# Patient Record
Sex: Female | Born: 1945
Health system: Southern US, Community
[De-identification: ages and names within clinical notes are randomized; demographics above are authoritative.]

## PROBLEM LIST (undated history)

## (undated) DIAGNOSIS — J189 Pneumonia, unspecified organism: Secondary | ICD-10-CM

## (undated) DIAGNOSIS — J449 Chronic obstructive pulmonary disease, unspecified: Secondary | ICD-10-CM

## (undated) HISTORY — PX: DILATION AND CURETTAGE, DIAGNOSTIC / THERAPEUTIC: SUR384

## (undated) HISTORY — DX: Pneumonia, unspecified organism: J18.9

## (undated) HISTORY — PX: SKIN CANCER EXCISION: SHX779

---

## 1999-02-07 ENCOUNTER — Encounter: Payer: Self-pay | Admitting: Emergency Medicine

## 1999-02-07 ENCOUNTER — Emergency Department (HOSPITAL_COMMUNITY): Admission: EM | Admit: 1999-02-07 | Discharge: 1999-02-07 | Payer: Self-pay | Admitting: Emergency Medicine

## 1999-02-21 ENCOUNTER — Ambulatory Visit (HOSPITAL_COMMUNITY): Admission: RE | Admit: 1999-02-21 | Discharge: 1999-02-21 | Payer: Self-pay | Admitting: Family Medicine

## 2010-05-29 ENCOUNTER — Ambulatory Visit (HOSPITAL_COMMUNITY): Payer: Self-pay

## 2011-02-18 ENCOUNTER — Emergency Department (HOSPITAL_COMMUNITY)
Admission: EM | Admit: 2011-02-18 | Discharge: 2011-02-18 | Disposition: A | Discharge status: Home / Self Care | Payer: Self-pay

## 2011-02-18 MED ORDER — ADENOSINE 6 MG/2ML IV SOLN
INTRAVENOUS | Status: AC
  Filled 2011-02-18: qty 4

## 2012-10-01 ENCOUNTER — Telehealth: Payer: Self-pay | Admitting: Cardiovascular Disease

## 2012-12-08 ENCOUNTER — Telehealth: Payer: Self-pay | Admitting: Pharmacist Clinician (PhC)/ Clinical Pharmacy Specialist

## 2012-12-08 NOTE — Telephone Encounter (Signed)
called both numbers to schedule an appointment. Both numbers were disconnected

## 2012-12-12 ENCOUNTER — Encounter (INDEPENDENT_AMBULATORY_CARE_PROVIDER_SITE_OTHER): Payer: Medicare Other | Admitting: Physician Assistant

## 2012-12-12 ENCOUNTER — Ambulatory Visit: Payer: Self-pay | Admitting: Pharmacist Clinician (PhC)/ Clinical Pharmacy Specialist

## 2012-12-12 NOTE — Progress Notes (Deleted)
    Date:  12/12/2012   ID:  [name], DOB December 10, 1945, MRN [id number]  PCP:  Blane Ohara, MD  Primary Cardiologist:  ***     History of Present Illness: Deborah Wallace is a 67 y.o. female ***  The patient currently denies nausea, vomiting, fever, chest pain, shortness of breath, orthopnea, dizziness, PND, cough, congestion, abdominal pain, hematochezia, melena, lower extremity edema, claudication.  Wt Readings from Last 3 Encounters:  No data found for Wt     No past medical history on file.  Current Outpatient Prescriptions  Medication Sig Dispense Refill  . atorvastatin (LIPITOR) 80 MG tablet Take 80 mg by mouth daily.       No current facility-administered medications for this visit.    Allergies:   Allergies not on file  Social History:  The patient     Family history:  No family history on file.  ROS:  Please see the history of present illness.  All other systems reviewed and negative.   PHYSICAL EXAM: VS:  There were no vitals taken for this visit. Well nourished, well developed, in no acute distress HEENT: Pupils are equal round react to light accommodation extraocular movements are intact.  Neck: no JVDNo cervical lymphadenopathy. Cardiac: Regular rate and rhythm without murmurs rubs or gallops. Lungs:  clear to auscultation bilaterally, no wheezing, rhonchi or rales Abd: soft, nontender, positive bowel sounds all quadrants, no hepatosplenomegaly Ext: no lower extremity edema.  2+ radial and dorsalis pedis pulses. Skin: warm and dry Neuro:  Grossly normal  EKG:       ASSESSMENT AND PLAN:  Problem List Items Addressed This Visit   None

## 2012-12-16 NOTE — Progress Notes (Signed)
This encounter was created in error - please disregard.

## 2013-06-22 ENCOUNTER — Other Ambulatory Visit: Payer: Medicare Other

## 2013-07-28 NOTE — Telephone Encounter (Signed)
Close encounter 

## 2016-03-30 DIAGNOSIS — J449 Chronic obstructive pulmonary disease, unspecified: Secondary | ICD-10-CM | POA: Diagnosis not present

## 2016-03-30 DIAGNOSIS — F4321 Adjustment disorder with depressed mood: Secondary | ICD-10-CM | POA: Diagnosis not present

## 2016-03-30 DIAGNOSIS — H6981 Other specified disorders of Eustachian tube, right ear: Secondary | ICD-10-CM | POA: Diagnosis not present

## 2016-03-30 DIAGNOSIS — R634 Abnormal weight loss: Secondary | ICD-10-CM | POA: Diagnosis not present

## 2016-03-30 DIAGNOSIS — J4530 Mild persistent asthma, uncomplicated: Secondary | ICD-10-CM | POA: Diagnosis not present

## 2016-03-30 DIAGNOSIS — Z79899 Other long term (current) drug therapy: Secondary | ICD-10-CM | POA: Diagnosis not present

## 2016-03-30 DIAGNOSIS — Z0000 Encounter for general adult medical examination without abnormal findings: Secondary | ICD-10-CM | POA: Diagnosis not present

## 2016-04-30 DIAGNOSIS — F4321 Adjustment disorder with depressed mood: Secondary | ICD-10-CM | POA: Diagnosis not present

## 2016-04-30 DIAGNOSIS — R634 Abnormal weight loss: Secondary | ICD-10-CM | POA: Diagnosis not present

## 2016-04-30 DIAGNOSIS — R29898 Other symptoms and signs involving the musculoskeletal system: Secondary | ICD-10-CM | POA: Diagnosis not present

## 2016-07-30 DIAGNOSIS — Z13820 Encounter for screening for osteoporosis: Secondary | ICD-10-CM | POA: Diagnosis not present

## 2016-07-30 DIAGNOSIS — Z1231 Encounter for screening mammogram for malignant neoplasm of breast: Secondary | ICD-10-CM | POA: Diagnosis not present

## 2016-07-30 DIAGNOSIS — R634 Abnormal weight loss: Secondary | ICD-10-CM | POA: Diagnosis not present

## 2016-07-30 DIAGNOSIS — Z780 Asymptomatic menopausal state: Secondary | ICD-10-CM | POA: Diagnosis not present

## 2016-07-30 DIAGNOSIS — F4321 Adjustment disorder with depressed mood: Secondary | ICD-10-CM | POA: Diagnosis not present

## 2016-09-17 DIAGNOSIS — Z1231 Encounter for screening mammogram for malignant neoplasm of breast: Secondary | ICD-10-CM | POA: Diagnosis not present

## 2016-09-17 DIAGNOSIS — R928 Other abnormal and inconclusive findings on diagnostic imaging of breast: Secondary | ICD-10-CM | POA: Diagnosis not present

## 2017-01-03 DIAGNOSIS — R229 Localized swelling, mass and lump, unspecified: Secondary | ICD-10-CM | POA: Diagnosis not present

## 2017-01-15 DIAGNOSIS — C44399 Other specified malignant neoplasm of skin of other parts of face: Secondary | ICD-10-CM | POA: Diagnosis not present

## 2017-01-15 DIAGNOSIS — C44319 Basal cell carcinoma of skin of other parts of face: Secondary | ICD-10-CM | POA: Diagnosis not present

## 2017-01-22 DIAGNOSIS — D485 Neoplasm of uncertain behavior of skin: Secondary | ICD-10-CM | POA: Diagnosis not present

## 2017-01-22 DIAGNOSIS — L989 Disorder of the skin and subcutaneous tissue, unspecified: Secondary | ICD-10-CM | POA: Diagnosis not present

## 2017-01-23 DIAGNOSIS — C4490 Unspecified malignant neoplasm of skin, unspecified: Secondary | ICD-10-CM | POA: Diagnosis not present

## 2017-01-28 DIAGNOSIS — J4530 Mild persistent asthma, uncomplicated: Secondary | ICD-10-CM | POA: Diagnosis not present

## 2017-01-28 DIAGNOSIS — J449 Chronic obstructive pulmonary disease, unspecified: Secondary | ICD-10-CM | POA: Diagnosis not present

## 2017-01-28 DIAGNOSIS — F4321 Adjustment disorder with depressed mood: Secondary | ICD-10-CM | POA: Diagnosis not present

## 2017-01-28 DIAGNOSIS — L989 Disorder of the skin and subcutaneous tissue, unspecified: Secondary | ICD-10-CM | POA: Diagnosis not present

## 2017-01-30 DIAGNOSIS — C44329 Squamous cell carcinoma of skin of other parts of face: Secondary | ICD-10-CM | POA: Diagnosis not present

## 2017-01-30 DIAGNOSIS — D485 Neoplasm of uncertain behavior of skin: Secondary | ICD-10-CM | POA: Diagnosis not present

## 2017-01-30 DIAGNOSIS — L989 Disorder of the skin and subcutaneous tissue, unspecified: Secondary | ICD-10-CM | POA: Diagnosis not present

## 2017-01-30 DIAGNOSIS — J449 Chronic obstructive pulmonary disease, unspecified: Secondary | ICD-10-CM | POA: Diagnosis not present

## 2017-01-30 DIAGNOSIS — F17200 Nicotine dependence, unspecified, uncomplicated: Secondary | ICD-10-CM | POA: Diagnosis not present

## 2017-01-30 DIAGNOSIS — S0990XA Unspecified injury of head, initial encounter: Secondary | ICD-10-CM | POA: Diagnosis not present

## 2017-01-30 DIAGNOSIS — S0180XA Unspecified open wound of other part of head, initial encounter: Secondary | ICD-10-CM | POA: Diagnosis not present

## 2017-01-30 DIAGNOSIS — S0031XA Abrasion of nose, initial encounter: Secondary | ICD-10-CM | POA: Diagnosis not present

## 2017-01-30 DIAGNOSIS — W19XXXA Unspecified fall, initial encounter: Secondary | ICD-10-CM | POA: Diagnosis not present

## 2017-01-30 DIAGNOSIS — L7622 Postprocedural hemorrhage and hematoma of skin and subcutaneous tissue following other procedure: Secondary | ICD-10-CM | POA: Diagnosis not present

## 2017-01-30 DIAGNOSIS — S098XXA Other specified injuries of head, initial encounter: Secondary | ICD-10-CM | POA: Diagnosis not present

## 2017-01-30 DIAGNOSIS — M542 Cervicalgia: Secondary | ICD-10-CM | POA: Diagnosis not present

## 2017-01-31 DIAGNOSIS — W19XXXD Unspecified fall, subsequent encounter: Secondary | ICD-10-CM | POA: Diagnosis not present

## 2017-01-31 DIAGNOSIS — Z5189 Encounter for other specified aftercare: Secondary | ICD-10-CM | POA: Diagnosis not present

## 2017-01-31 DIAGNOSIS — S0101XD Laceration without foreign body of scalp, subsequent encounter: Secondary | ICD-10-CM | POA: Diagnosis not present

## 2017-03-07 DIAGNOSIS — C4492 Squamous cell carcinoma of skin, unspecified: Secondary | ICD-10-CM | POA: Diagnosis not present

## 2017-03-07 DIAGNOSIS — T8131XD Disruption of external operation (surgical) wound, not elsewhere classified, subsequent encounter: Secondary | ICD-10-CM | POA: Diagnosis not present

## 2017-05-25 ENCOUNTER — Other Ambulatory Visit: Payer: Self-pay

## 2017-05-25 ENCOUNTER — Encounter (HOSPITAL_COMMUNITY): Payer: Self-pay | Admitting: Emergency Medicine

## 2017-05-25 ENCOUNTER — Emergency Department (HOSPITAL_COMMUNITY): Payer: Medicare Other

## 2017-05-25 ENCOUNTER — Inpatient Hospital Stay (HOSPITAL_COMMUNITY)
Admission: EM | Admit: 2017-05-25 | Discharge: 2017-05-31 | DRG: 190 | Disposition: A | Discharge status: Home Health | Payer: Medicare Other | Attending: Internal Medicine | Admitting: Internal Medicine

## 2017-05-25 DIAGNOSIS — J209 Acute bronchitis, unspecified: Secondary | ICD-10-CM | POA: Diagnosis present

## 2017-05-25 DIAGNOSIS — I493 Ventricular premature depolarization: Secondary | ICD-10-CM | POA: Diagnosis present

## 2017-05-25 DIAGNOSIS — E440 Moderate protein-calorie malnutrition: Secondary | ICD-10-CM | POA: Diagnosis present

## 2017-05-25 DIAGNOSIS — J9601 Acute respiratory failure with hypoxia: Secondary | ICD-10-CM | POA: Diagnosis not present

## 2017-05-25 DIAGNOSIS — Z6823 Body mass index (BMI) 23.0-23.9, adult: Secondary | ICD-10-CM | POA: Diagnosis not present

## 2017-05-25 DIAGNOSIS — R05 Cough: Secondary | ICD-10-CM | POA: Diagnosis not present

## 2017-05-25 DIAGNOSIS — J440 Chronic obstructive pulmonary disease with acute lower respiratory infection: Secondary | ICD-10-CM | POA: Diagnosis present

## 2017-05-25 DIAGNOSIS — D649 Anemia, unspecified: Secondary | ICD-10-CM | POA: Diagnosis present

## 2017-05-25 DIAGNOSIS — F17210 Nicotine dependence, cigarettes, uncomplicated: Secondary | ICD-10-CM | POA: Diagnosis present

## 2017-05-25 DIAGNOSIS — R0602 Shortness of breath: Secondary | ICD-10-CM | POA: Diagnosis not present

## 2017-05-25 DIAGNOSIS — R0789 Other chest pain: Secondary | ICD-10-CM | POA: Diagnosis not present

## 2017-05-25 DIAGNOSIS — Z79899 Other long term (current) drug therapy: Secondary | ICD-10-CM

## 2017-05-25 DIAGNOSIS — R7989 Other specified abnormal findings of blood chemistry: Secondary | ICD-10-CM | POA: Diagnosis present

## 2017-05-25 DIAGNOSIS — R079 Chest pain, unspecified: Secondary | ICD-10-CM | POA: Diagnosis present

## 2017-05-25 DIAGNOSIS — R402413 Glasgow coma scale score 13-15, at hospital admission: Secondary | ICD-10-CM | POA: Diagnosis present

## 2017-05-25 DIAGNOSIS — J441 Chronic obstructive pulmonary disease with (acute) exacerbation: Principal | ICD-10-CM | POA: Diagnosis present

## 2017-05-25 HISTORY — DX: Chronic obstructive pulmonary disease, unspecified: J44.9

## 2017-05-25 LAB — URINALYSIS, ROUTINE W REFLEX MICROSCOPIC
Bilirubin Urine: NEGATIVE
Glucose, UA: NEGATIVE mg/dL
KETONES UR: NEGATIVE mg/dL
Leukocytes, UA: NEGATIVE
Nitrite: NEGATIVE
PROTEIN: 100 mg/dL — AB
Specific Gravity, Urine: 1.028 (ref 1.005–1.030)
pH: 5 (ref 5.0–8.0)

## 2017-05-25 LAB — BASIC METABOLIC PANEL
ANION GAP: 15 (ref 5–15)
BUN: 34 mg/dL — AB (ref 6–20)
CHLORIDE: 96 mmol/L — AB (ref 101–111)
CO2: 27 mmol/L (ref 22–32)
Calcium: 9.6 mg/dL (ref 8.9–10.3)
Creatinine, Ser: 0.99 mg/dL (ref 0.44–1.00)
GFR calc Af Amer: >60 mL/min (ref >60)
GFR, EST NON AFRICAN AMERICAN: 55 mL/min — AB (ref >60)
GLUCOSE: 197 mg/dL — AB (ref 65–99)
POTASSIUM: 3.9 mmol/L (ref 3.5–5.1)
Sodium: 138 mmol/L (ref 135–145)

## 2017-05-25 LAB — COMPREHENSIVE METABOLIC PANEL
ALK PHOS: 98 U/L (ref 38–126)
ALT: 10 U/L — ABNORMAL LOW (ref 14–54)
AST: 17 U/L (ref 15–41)
Albumin: 2.6 g/dL — ABNORMAL LOW (ref 3.5–5.0)
Anion gap: 14 (ref 5–15)
BUN: 34 mg/dL — AB (ref 6–20)
CALCIUM: 9.4 mg/dL (ref 8.9–10.3)
CHLORIDE: 98 mmol/L — AB (ref 101–111)
CO2: 27 mmol/L (ref 22–32)
Creatinine, Ser: 1.01 mg/dL — ABNORMAL HIGH (ref 0.44–1.00)
GFR calc Af Amer: >60 mL/min (ref >60)
GFR calc non Af Amer: 54 mL/min — ABNORMAL LOW (ref >60)
Glucose, Bld: 193 mg/dL — ABNORMAL HIGH (ref 65–99)
Potassium: 3.8 mmol/L (ref 3.5–5.1)
SODIUM: 139 mmol/L (ref 135–145)
Total Bilirubin: 0.6 mg/dL (ref 0.3–1.2)
Total Protein: 7.6 g/dL (ref 6.5–8.1)

## 2017-05-25 LAB — I-STAT CG4 LACTIC ACID, ED
Lactic Acid, Venous: 2.33 mmol/L (ref 0.5–1.9)
Lactic Acid, Venous: 1.48 mmol/L (ref 0.5–1.9)

## 2017-05-25 LAB — CBC
HCT: 38 % (ref 36.0–46.0)
HEMOGLOBIN: 12.5 g/dL (ref 12.0–15.0)
MCH: 32.1 pg (ref 26.0–34.0)
MCHC: 32.9 g/dL (ref 30.0–36.0)
MCV: 97.4 fL (ref 78.0–100.0)
Platelets: 499 10*3/uL — ABNORMAL HIGH (ref 150–400)
RBC: 3.9 MIL/uL (ref 3.87–5.11)
RDW: 13.3 % (ref 11.5–15.5)
WBC: 23 10*3/uL — AB (ref 4.0–10.5)

## 2017-05-25 LAB — I-STAT TROPONIN, ED: TROPONIN I, POC: 0.02 ng/mL (ref 0.00–0.08)

## 2017-05-25 LAB — INFLUENZA PANEL BY PCR (TYPE A & B)
Influenza A By PCR: NEGATIVE
Influenza B By PCR: NEGATIVE

## 2017-05-25 LAB — BRAIN NATRIURETIC PEPTIDE: B NATRIURETIC PEPTIDE 5: 390.9 pg/mL — AB (ref 0.0–100.0)

## 2017-05-25 MED ORDER — PREDNISONE 20 MG PO TABS
40.0000 mg | ORAL_TABLET | Freq: Every day | ORAL | Status: DC
  Administered 2017-05-26 – 2017-05-27 (×2): 40 mg via ORAL
  Filled 2017-05-25 (×2): qty 2

## 2017-05-25 MED ORDER — AZITHROMYCIN 250 MG PO TABS
500.0000 mg | ORAL_TABLET | Freq: Once | ORAL | Status: AC
  Administered 2017-05-25: 500 mg via ORAL
  Filled 2017-05-25: qty 2

## 2017-05-25 MED ORDER — ACETAMINOPHEN 650 MG RE SUPP: 650.0000 mg | Freq: Four times a day (QID) | RECTAL | Status: DC | PRN

## 2017-05-25 MED ORDER — ENSURE ENLIVE PO LIQD
237.0000 mL | Freq: Two times a day (BID) | ORAL | Status: DC
  Administered 2017-05-26 – 2017-05-31 (×9): 237 mL via ORAL

## 2017-05-25 MED ORDER — ONDANSETRON HCL 4 MG PO TABS: 4.0000 mg | ORAL_TABLET | Freq: Four times a day (QID) | ORAL | Status: DC | PRN

## 2017-05-25 MED ORDER — SODIUM CHLORIDE 0.9 % IV SOLN
1.0000 g | INTRAVENOUS | Status: DC
  Administered 2017-05-25 – 2017-05-28 (×4): 1 g via INTRAVENOUS
  Filled 2017-05-25 (×5): qty 10

## 2017-05-25 MED ORDER — NICOTINE 21 MG/24HR TD PT24
21.0000 mg | MEDICATED_PATCH | Freq: Every day | TRANSDERMAL | Status: DC
  Administered 2017-05-26 – 2017-05-31 (×7): 21 mg via TRANSDERMAL
  Filled 2017-05-25 (×7): qty 1

## 2017-05-25 MED ORDER — ENOXAPARIN SODIUM 40 MG/0.4ML ~~LOC~~ SOLN
40.0000 mg | SUBCUTANEOUS | Status: DC
  Administered 2017-05-26 – 2017-05-30 (×6): 40 mg via SUBCUTANEOUS
  Filled 2017-05-25 (×8): qty 0.4

## 2017-05-25 MED ORDER — MOMETASONE FURO-FORMOTEROL FUM 200-5 MCG/ACT IN AERO
2.0000 | INHALATION_SPRAY | Freq: Two times a day (BID) | RESPIRATORY_TRACT | Status: DC
  Administered 2017-05-26 – 2017-05-31 (×11): 2 via RESPIRATORY_TRACT
  Filled 2017-05-25: qty 8.8

## 2017-05-25 MED ORDER — PREDNISONE 20 MG PO TABS
60.0000 mg | ORAL_TABLET | Freq: Once | ORAL | Status: AC
  Administered 2017-05-25: 60 mg via ORAL
  Filled 2017-05-25: qty 3

## 2017-05-25 MED ORDER — IPRATROPIUM-ALBUTEROL 0.5-2.5 (3) MG/3ML IN SOLN
3.0000 mL | Freq: Four times a day (QID) | RESPIRATORY_TRACT | Status: DC
  Administered 2017-05-25 – 2017-05-29 (×13): 3 mL via RESPIRATORY_TRACT
  Filled 2017-05-25 (×14): qty 3

## 2017-05-25 MED ORDER — ACETAMINOPHEN 325 MG PO TABS
650.0000 mg | ORAL_TABLET | Freq: Four times a day (QID) | ORAL | Status: DC | PRN
  Administered 2017-05-28: 650 mg via ORAL
  Filled 2017-05-25: qty 2

## 2017-05-25 MED ORDER — ALBUTEROL SULFATE (2.5 MG/3ML) 0.083% IN NEBU: 2.5000 mg | INHALATION_SOLUTION | RESPIRATORY_TRACT | Status: DC | PRN

## 2017-05-25 MED ORDER — ALBUTEROL (5 MG/ML) CONTINUOUS INHALATION SOLN
10.0000 mg/h | INHALATION_SOLUTION | Freq: Once | RESPIRATORY_TRACT | Status: AC
  Administered 2017-05-25: 10 mg/h via RESPIRATORY_TRACT
  Filled 2017-05-25: qty 20

## 2017-05-25 MED ORDER — ONDANSETRON HCL 4 MG/2ML IJ SOLN
4.0000 mg | Freq: Four times a day (QID) | INTRAMUSCULAR | Status: DC | PRN
Start: 2017-05-25 — End: 2017-05-31

## 2017-05-25 MED ORDER — SODIUM CHLORIDE 0.9 % IV SOLN
INTRAVENOUS | Status: DC
  Administered 2017-05-25 – 2017-05-26 (×4): via INTRAVENOUS

## 2017-05-25 MED ORDER — AZITHROMYCIN 250 MG PO TABS
250.0000 mg | ORAL_TABLET | Freq: Every day | ORAL | Status: AC
  Administered 2017-05-26 – 2017-05-29 (×4): 250 mg via ORAL
  Filled 2017-05-25 (×4): qty 1

## 2017-05-25 NOTE — ED Provider Notes (Signed)
[facility] EMERGENCY DEPARTMENT Provider Note   CSN: [id number] Arrival date & time: 05/25/17  1633     History   Chief Complaint Chief Complaint  Patient presents with  . Shortness of Breath    HPI Deborah Wallace is a 72 y.o. female with history of COPD and asthma and tobacco abuse is here for evaluation of trouble breathing for the last 3 days.  Associated with cough with sputum production, fatigue, decreased appetite, decreased water intake since Tuesday. Daughter at bedside states she has been more confused and her speech is slower. On Sunday patient had a hoarse voice which has resolved. Has not been able to smoke cigarettes in the last 4 days due to shortness of breath. Has been using nebulizing machine at home with mild relief. No exertional or pleuritic chest pain, nausea, vomiting, abdominal pain, diarrhea, urinary symptoms, constipation.  No sick contacts. Recently relocated from [place].  HPI  Past Medical History:  Diagnosis Date  . COPD (chronic obstructive pulmonary disease) (HCC)     There are no active problems to display for this patient.   History reviewed. No pertinent surgical history.  OB History    No data available       Home Medications    Prior to Admission medications   Medication Sig Start Date End Date Taking? Authorizing Provider  albuterol (ACCUNEB) 1.25 MG/3ML nebulizer solution Take 1 ampule by nebulization every 6 (six) hours as needed for wheezing.   Yes [provider]  albuterol (PROVENTIL HFA;VENTOLIN HFA) 108 (90 Base) MCG/ACT inhaler Inhale into the lungs every 6 (six) hours as needed for wheezing or shortness of breath.   Yes [provider]  umeclidinium bromide (INCRUSE ELLIPTA) 62.5 MCG/INH AEPB Inhale 1 puff into the lungs daily.   Yes [provider]    Family History History reviewed. No pertinent family history.  Social History Social History   Tobacco Use  . Smoking status:  Current Some Day Smoker  . Smokeless tobacco: Never Used  Substance Use Topics  . Alcohol use: No    Frequency: Never  . Drug use: Not on file     Allergies   Patient has no known allergies.   Review of Systems Review of Systems  Constitutional: Positive for activity change and appetite change.  Respiratory: Positive for cough, chest tightness, shortness of breath and wheezing.   Psychiatric/Behavioral: Positive for confusion.  All other systems reviewed and are negative.    Physical Exam Updated Vital Signs BP (!) 125/56   Pulse 86   Temp 99.8 F (37.7 C) (Rectal)   Resp (!) 24   SpO2 98%   Physical Exam  Constitutional: She is oriented to person, place, and time. She appears well-developed and well-nourished. No distress.  Patient is pale appearing  HENT:  Head: Normocephalic and atraumatic.  Nose: Nose normal.  Mouth/Throat: No oropharyngeal exudate.  Dry lips and mucous membranes  Eyes: Conjunctivae and EOM are normal. Pupils are equal, round, and reactive to light.  Neck: Normal range of motion. Neck supple.  Cardiovascular: Regular rhythm, normal heart sounds and intact distal pulses. Tachycardia present.  No murmur heard. 2+ DP and radial pulses bilaterally. No LE edema.   Pulmonary/Chest: Effort normal. No respiratory distress. She has decreased breath sounds in the right middle field, the right lower field, the left middle field and the left lower field. She has wheezes in the right upper field and the left upper field. She exhibits no  tenderness.  Increased work of breathing using abdominal/thorax muscles. Hypoxic and tachypneic. Speaking in 1-2 word sentences. 67% on room air, improving with 4 L nasal cannula.  Abdominal: Soft. Bowel sounds are normal. She exhibits no distension. There is no tenderness.  No G/R/R. No suprapubic or CVA tenderness.   Musculoskeletal: Normal range of motion. She exhibits no deformity.  Lymphadenopathy:    She has no cervical  adenopathy.  Neurological: She is alert and oriented to person, place, and time. No sensory deficit.  Alert and oriented to self, place, event. Speech is fluent without obvious dysarthria or dysphasia. Strength 5/5 with hand grip and ankle F/E.   Sensation to light touch intact in hands and feet. No truncal sway. No pronator drift. No leg drop.  CN I and VIII not tested. CN II-XII grossly intact bilaterally.  Skin: Skin is warm and dry. Capillary refill takes less than 2 seconds.  Psychiatric: She has a normal mood and affect. Her behavior is normal. Judgment and thought content normal.  Nursing note and vitals reviewed.    ED Treatments / Results  Labs (all labs ordered are listed, but only abnormal results are displayed) Labs Reviewed  BASIC METABOLIC PANEL - Abnormal; Notable for the following components:      Result Value   Chloride 96 (*)    Glucose, Bld 197 (*)    BUN 34 (*)    GFR calc non Af Amer 55 (*)    All other components within normal limits  CBC - Abnormal; Notable for the following components:   WBC 23.0 (*)    Platelets 499 (*)    All other components within normal limits  COMPREHENSIVE METABOLIC PANEL - Abnormal; Notable for the following components:   Chloride 98 (*)    Glucose, Bld 193 (*)    BUN 34 (*)    Creatinine, Ser 1.01 (*)    Albumin 2.6 (*)    ALT 10 (*)    GFR calc non Af Amer 54 (*)    All other components within normal limits  URINALYSIS, ROUTINE W REFLEX MICROSCOPIC - Abnormal; Notable for the following components:   APPearance HAZY (*)    Hgb urine dipstick SMALL (*)    Protein, ur 100 (*)    Bacteria, UA RARE (*)    Squamous Epithelial / LPF 0-5 (*)    All other components within normal limits  BRAIN NATRIURETIC PEPTIDE - Abnormal; Notable for the following components:   B Natriuretic Peptide 390.9 (*)    All other components within normal limits  I-STAT CG4 LACTIC ACID, ED - Abnormal; Notable for the following components:   Lactic  Acid, Venous 2.33 (*)    All other components within normal limits  CULTURE, BLOOD (ROUTINE X 2)  CULTURE, BLOOD (ROUTINE X 2)  URINE CULTURE  INFLUENZA PANEL BY PCR (TYPE A & B)  I-STAT TROPONIN, ED  I-STAT CG4 LACTIC ACID, ED    EKG  EKG Interpretation None       Radiology Dg Chest Port 1 View  Result Date: 05/25/2017 CLINICAL DATA:  Shortness of Breath for several days EXAM: PORTABLE CHEST 1 VIEW COMPARISON:  None. FINDINGS: Cardiac shadow is within normal limits. The lungs are well aerated bilaterally. Mild interstitial changes are seen without focal infiltrate or sizable effusion. No bony abnormality is noted. IMPRESSION: Diffuse interstitial changes are noted likely of a chronic nature. No acute infiltrate is seen. Electronically Signed   By: Linus Mako.D.  On: 05/25/2017 17:28    Procedures Procedures (including critical care time)  Medications Ordered in ED Medications  predniSONE (DELTASONE) tablet 60 mg (60 mg Oral Given 05/25/17 1722)  albuterol (PROVENTIL,VENTOLIN) solution continuous neb (10 mg/hr Nebulization Given 05/25/17 1731)     Initial Impression / Assessment and Plan / ED Course  I have reviewed the triage vital signs and the nursing notes.  Pertinent labs & imaging results that were available during my care of the patient were reviewed by me and considered in my medical decision making (see chart for details).  Clinical Course as of May 26 2019  Sat May 25, 2017  1802 Hgb urine dipstick: (!) SMALL [CG]  1802 WBC: (!) 23.0 [CG]  1802 Lactic Acid, Venous: (!!) 2.33 [CG]  1802 Pulse Rate: (!) 101 [CG]  1802 Resp: (!) 24 [CG]  1802 SpO2: (!) 67 % [CG]  2019 IMPRESSION: Diffuse interstitial changes are noted likely of a chronic nature. No acute infiltrate is seen. DG Chest Port 1 View [CG]    Clinical Course User Index [CG] Kinnie Feil, PA-C   Differential includes COPD/asthma exacerbation, viral illness/flu, pneumonia. Less likely CHF or PE  as patient has no history of these or risk factors for pulmonary embolism.  Final Clinical Impressions(s) / ED Diagnoses   Lab work and imaging reviewed. Leukocytosis at 23, lactic acid 2.33. No rectal fever. Chest x-ray without acute infiltrate showing chronic COPD changes. Patient received oral prednisone and continuous DuoNeb that helped her shortness of breath and chest tightness however she desaturated to 85% on room air while at rest and reevaluation. Patient does not use chronic oxygen at home, recently moved to the area and does not have a PCP to follow up with. Will request admission for COPD management. Influenza negative. Blood and urine cultures sent.  Final diagnoses:  COPD exacerbation [facility])    ED Discharge Orders    None       Arlean Hopping 05/25/17 2021    Orlie Dakin, MD 05/25/17 [phone]

## 2017-05-25 NOTE — ED Provider Notes (Signed)
Complains of progressively worsening shortness of breath with cough productive of white sputum for the past 4 days.  She been treating herself with her home nebulizer, without relief.  On exam alert, chronically ill-appearing.  Speaks in sentences.  No respiratory distress while on supplemental oxygen.  Lungs with diffuse scant dry crackles chestx-ray viewed by me   Orlie Dakin, MD 05/25/17 [phone]

## 2017-05-25 NOTE — ED Notes (Signed)
Dr. Garner in to assess pt for admission 

## 2017-05-25 NOTE — H&P (Signed)
History and Physical    Deborah Wallace UMP:[id number] DOB: 29-Jan-1946 DOA: 05/25/2017  PCP: System, Pcp Not In  Patient coming from: Home  I have personally briefly reviewed patient's old medical records in [place]  Chief Complaint: SOB, Cough  HPI: Deborah Wallace is a 72 y.o. female with medical history significant of COPD.  Patient presents to the ED with c/o progressively worsening SOB, cough productive of white sputum.  Symptoms onset 4 days ago.  Treating self with home neb without relief.  Normally smokes 1PPD for past 40 years, hasnt smoked in past 4 days.  No fever/chills.   ED Course: Initially resp distress and satting 67% RA.  Given prednisone, nebs.  Satting well on [place] and resp distress resolved but desats to 85% on RA still.   Review of Systems: As per HPI otherwise 10 point review of systems negative.   Past Medical History:  Diagnosis Date  . COPD (chronic obstructive pulmonary disease) ([place])     History reviewed. No pertinent surgical history.   reports that she has been smoking.  she has never used smokeless tobacco. She reports that she does not drink alcohol. Her drug history is not on file.  No Known Allergies  History reviewed. No pertinent family history. No family history of emphysema per patient.  Prior to Admission medications   Medication Sig Start Date End Date Taking? Authorizing Provider  albuterol (ACCUNEB) 1.25 MG/3ML nebulizer solution Take 1 ampule by nebulization every 6 (six) hours as needed for wheezing.   Yes [provider]  albuterol (PROVENTIL HFA;VENTOLIN HFA) 108 (90 Base) MCG/ACT inhaler Inhale into the lungs every 6 (six) hours as needed for wheezing or shortness of breath.   Yes [provider]  umeclidinium bromide (INCRUSE ELLIPTA) 62.5 MCG/INH AEPB Inhale 1 puff into the lungs daily.   Yes [provider]    Physical Exam: Vitals:   05/25/17 1836 05/25/17 1841 05/25/17 2000 05/25/17 2016    BP:   114/65   Pulse: 91 86 76   Resp: (!) 29 (!) 24 20   Temp:    97.7 F (36.5 C)  TempSrc:      SpO2: (!) 74% 98% 99%     Constitutional: NAD, calm, comfortable Eyes: PERRL, lids and conjunctivae normal ENMT: Mucous membranes are moist. Posterior pharynx clear of any exudate or lesions.Normal dentition.  Neck: normal, supple, no masses, no thyromegaly Respiratory: Diffuse wheezes, no accessory muscle use or respiratory distress while on O2. Cardiovascular: Regular rate and rhythm, no murmurs / rubs / gallops. No extremity edema. 2+ pedal pulses. No carotid bruits.  Abdomen: no tenderness, no masses palpated. No hepatosplenomegaly. Bowel sounds positive.  Musculoskeletal: no clubbing / cyanosis. No joint deformity upper and lower extremities. Good ROM, no contractures. Normal muscle tone.  Skin: no rashes, lesions, ulcers. No induration Neurologic: CN 2-12 grossly intact. Sensation intact, DTR normal. Strength 5/5 in all 4.  Psychiatric: Normal judgment and insight. Alert and oriented x 3. Normal mood.    Labs on Admission: I have personally reviewed following labs and imaging studies  CBC: Recent Labs  Lab 05/25/17 1648  WBC 23.0*  HGB 12.5  HCT 38.0  MCV 97.4  PLT 400*   Basic Metabolic Panel: Recent Labs  Lab 05/25/17 1648 05/25/17 1709  NA 138 139  K 3.9 3.8  CL 96* 98*  CO2 27 27  GLUCOSE 197* 193*  BUN 34* 34*  CREATININE 0.99 1.01*  CALCIUM 9.6 9.4  GFR: CrCl cannot be calculated (Unknown ideal weight.). Liver Function Tests: Recent Labs  Lab 05/25/17 1709  AST 17  ALT 10*  ALKPHOS 98  BILITOT 0.6  PROT 7.6  ALBUMIN 2.6*   No results for input(s): LIPASE, AMYLASE in the last 168 hours. No results for input(s): AMMONIA in the last 168 hours. Coagulation Profile: No results for input(s): INR, PROTIME in the last 168 hours. Cardiac Enzymes: No results for input(s): CKTOTAL, CKMB, CKMBINDEX, TROPONINI in the last 168 hours. BNP (last 3  results) No results for input(s): PROBNP in the last 8760 hours. HbA1C: No results for input(s): HGBA1C in the last 72 hours. CBG: No results for input(s): GLUCAP in the last 168 hours. Lipid Profile: No results for input(s): CHOL, HDL, LDLCALC, TRIG, CHOLHDL, LDLDIRECT in the last 72 hours. Thyroid Function Tests: No results for input(s): TSH, T4TOTAL, FREET4, T3FREE, THYROIDAB in the last 72 hours. Anemia Panel: No results for input(s): VITAMINB12, FOLATE, FERRITIN, TIBC, IRON, RETICCTPCT in the last 72 hours. Urine analysis:    Component Value Date/Time   COLORURINE YELLOW 05/25/2017 1730   APPEARANCEUR HAZY (A) 05/25/2017 1730   LABSPEC 1.028 05/25/2017 1730   PHURINE 5.0 05/25/2017 1730   GLUCOSEU NEGATIVE 05/25/2017 1730   HGBUR SMALL (A) 05/25/2017 1730   BILIRUBINUR NEGATIVE 05/25/2017 1730   KETONESUR NEGATIVE 05/25/2017 1730   PROTEINUR 100 (A) 05/25/2017 1730   NITRITE NEGATIVE 05/25/2017 1730   LEUKOCYTESUR NEGATIVE 05/25/2017 1730    Radiological Exams on Admission: Dg Chest Port 1 View  Result Date: 05/25/2017 CLINICAL DATA:  Shortness of Breath for several days EXAM: PORTABLE CHEST 1 VIEW COMPARISON:  None. FINDINGS: Cardiac shadow is within normal limits. The lungs are well aerated bilaterally. Mild interstitial changes are seen without focal infiltrate or sizable effusion. No bony abnormality is noted. IMPRESSION: Diffuse interstitial changes are noted likely of a chronic nature. No acute infiltrate is seen. Electronically Signed   By: Inez Catalina M.D.   On: 05/25/2017 17:28    EKG: Independently reviewed.  Assessment/Plan Principal Problem:   COPD exacerbation (HCC) Active Problems:   Acute respiratory failure with hypoxia ([place])    1. COPD exacerbation - with new O2 requirement 1. COPD pathway 2. Neb treatments 3. Scheduled ipratropium 4. Inh steroids 5. Prednisone PO daily 6. Given interstitial changes, productive cough, WBC 23k, new O2  requirement: 1. Will make her "COPD exacerbation antibiotics" rocephin + azithro this admit. 7. Influenza Neg 8. O2 via [place] 9. Cont pulse ox 2. Slight BUN elevation - 1. IVF: NS at 125 cc/hr 2. Repeat BMP in AM  DVT prophylaxis: Lovenox Code Status: Full Family Communication: No family in room Disposition Plan: Home after admit Consults called: None Admission status: Admit to inpatient   [place] [place] [facility] Pager [phone]  If 7AM-7PM, please contact day team taking care of patient [email or web address] Password [email or web address]  05/25/2017, 8:41 PM

## 2017-05-25 NOTE — ED Triage Notes (Signed)
Patient presents to ED for assessment of SOB "COPD exacerbation" that patient states has been going on for several weeks, but her daughter finally made her come today.  C/o intermittent chest pain

## 2017-05-26 LAB — CBC
HEMATOCRIT: 32.6 % — AB (ref 36.0–46.0)
Hemoglobin: 10.5 g/dL — ABNORMAL LOW (ref 12.0–15.0)
MCH: 31.9 pg (ref 26.0–34.0)
MCHC: 32.2 g/dL (ref 30.0–36.0)
MCV: 99.1 fL (ref 78.0–100.0)
Platelets: 363 10*3/uL (ref 150–400)
RBC: 3.29 MIL/uL — ABNORMAL LOW (ref 3.87–5.11)
RDW: 13.7 % (ref 11.5–15.5)
WBC: 17 10*3/uL — ABNORMAL HIGH (ref 4.0–10.5)

## 2017-05-26 LAB — BASIC METABOLIC PANEL
Anion gap: 9 (ref 5–15)
BUN: 35 mg/dL — AB (ref 6–20)
CALCIUM: 8.5 mg/dL — AB (ref 8.9–10.3)
CO2: 30 mmol/L (ref 22–32)
CREATININE: 0.88 mg/dL (ref 0.44–1.00)
Chloride: 100 mmol/L — ABNORMAL LOW (ref 101–111)
GFR calc non Af Amer: >60 mL/min (ref >60)
Glucose, Bld: 201 mg/dL — ABNORMAL HIGH (ref 65–99)
Potassium: 3.7 mmol/L (ref 3.5–5.1)
SODIUM: 139 mmol/L (ref 135–145)

## 2017-05-26 LAB — URINE CULTURE: Culture: NO GROWTH

## 2017-05-26 NOTE — Progress Notes (Signed)
PROGRESS NOTE    LAURELIN ELSON  YCX:[id number] DOB: June 06, 1945 DOA: 05/25/2017 PCP: System, Pcp Not In   Brief Narrative: Deborah Wallace is a 72 y.o. female with medical history significant of COPD. She presented with a COPD exacerbation.   Assessment & Plan:   Principal Problem:   COPD exacerbation ([place]) Active Problems:   Acute respiratory failure with hypoxia (HCC)   COPD exacerbation Acute respiratory failure with hypoxia Still requiring oxygen. Still not at baseline with significant wheezing -Continue bronchodilators/steroids -Continue ceftriaxone/azithromycin -PT eval   DVT prophylaxis: Lovenox Code Status:   Code Status: Full Code Family Communication: None at bedside Disposition Plan: Discharge in 24-48 hours pending clinical improvement   Consultants:   None  Procedures:   None  Antimicrobials:  Ceftriaxone  Azithromycin    Subjective: No issues overnight. Figures that she has wheezing.  Objective: Vitals:   05/26/17 0730 05/26/17 0801 05/26/17 1119 05/26/17 1201  BP:  (!) 109/51  (!) 105/46  Pulse:  75  86  Resp:  16  16  Temp:  98.1 F (36.7 C)  97.8 F (36.6 C)  TempSrc:  Oral  Oral  SpO2: 94% 99% 97% 99%  Weight:      Height:        Intake/Output Summary (Last 24 hours) at 05/26/2017 1308 Last data filed at 05/26/2017 0550 Gross per 24 hour  Intake 1210.42 ml  Output 100 ml  Net 1110.42 ml   Filed Weights   05/25/17 2231  Weight: 53.3 kg (117 lb 8.1 oz)    Examination:  General exam: Appears calm and comfortable Respiratory system: Diffuse wheezing with prolonged expiratory phase. Respiratory effort normal. Cardiovascular system: S1 & S2 heard, RRR. No murmurs, rubs, gallops or clicks. Gastrointestinal system: Abdomen is nondistended, soft and nontender. No organomegaly or masses felt. Normal bowel sounds heard. Central nervous system: Alert and oriented. No focal neurological deficits. Extremities: No edema. No calf  tenderness Skin: No cyanosis. No rashes Psychiatry: Judgement and insight appear normal. Mood & affect appropriate.     Data Reviewed: I have personally reviewed following labs and imaging studies  CBC: Recent Labs  Lab 05/25/17 1648 05/26/17 0348  WBC 23.0* 17.0*  HGB 12.5 10.5*  HCT 38.0 32.6*  MCV 97.4 99.1  PLT 499* 497   Basic Metabolic Panel: Recent Labs  Lab 05/25/17 1648 05/25/17 1709 05/26/17 0348  NA 138 139 139  K 3.9 3.8 3.7  CL 96* 98* 100*  CO2 27 27 30   GLUCOSE 197* 193* 201*  BUN 34* 34* 35*  CREATININE 0.99 1.01* 0.88  CALCIUM 9.6 9.4 8.5*   GFR: Estimated Creatinine Clearance: 42.4 mL/min (by C-G formula based on SCr of 0.88 mg/dL). Liver Function Tests: Recent Labs  Lab 05/25/17 1709  AST 17  ALT 10*  ALKPHOS 98  BILITOT 0.6  PROT 7.6  ALBUMIN 2.6*   No results for input(s): LIPASE, AMYLASE in the last 168 hours. No results for input(s): AMMONIA in the last 168 hours. Coagulation Profile: No results for input(s): INR, PROTIME in the last 168 hours. Cardiac Enzymes: No results for input(s): CKTOTAL, CKMB, CKMBINDEX, TROPONINI in the last 168 hours. BNP (last 3 results) No results for input(s): PROBNP in the last 8760 hours. HbA1C: No results for input(s): HGBA1C in the last 72 hours. CBG: No results for input(s): GLUCAP in the last 168 hours. Lipid Profile: No results for input(s): CHOL, HDL, LDLCALC, TRIG, CHOLHDL, LDLDIRECT in the last 72 hours. Thyroid Function  Tests: No results for input(s): TSH, T4TOTAL, FREET4, T3FREE, THYROIDAB in the last 72 hours. Anemia Panel: No results for input(s): VITAMINB12, FOLATE, FERRITIN, TIBC, IRON, RETICCTPCT in the last 72 hours. Sepsis Labs: Recent Labs  Lab 05/25/17 1710 05/25/17 2052  LATICACIDVEN 2.33* 1.48    No results found for this or any previous visit (from the past 240 hour(s)).       Radiology Studies: Dg Chest Port 1 View  Result Date: 05/25/2017 CLINICAL DATA:   Shortness of Breath for several days EXAM: PORTABLE CHEST 1 VIEW COMPARISON:  None. FINDINGS: Cardiac shadow is within normal limits. The lungs are well aerated bilaterally. Mild interstitial changes are seen without focal infiltrate or sizable effusion. No bony abnormality is noted. IMPRESSION: Diffuse interstitial changes are noted likely of a chronic nature. No acute infiltrate is seen. Electronically Signed   By: Inez Catalina M.D.   On: 05/25/2017 17:28        Scheduled Meds: . azithromycin  250 mg Oral Daily  . enoxaparin (LOVENOX) injection  40 mg Subcutaneous Q24H  . feeding supplement (ENSURE ENLIVE)  237 mL Oral BID BM  . ipratropium-albuterol  3 mL Nebulization QID  . mometasone-formoterol  2 puff Inhalation BID  . nicotine  21 mg Transdermal Daily  . predniSONE  40 mg Oral Q breakfast   Continuous Infusions: . sodium chloride 125 mL/hr at 05/26/17 0551  . cefTRIAXone (ROCEPHIN)  IV Stopped (05/25/17 2330)     LOS: 1 day     Cordelia Poche, MD [facility] 05/26/2017, 1:08 PM Pager: [phone]  If 7PM-7AM, please contact night-coverage [email or web address] Password [email or web address] 05/26/2017, 1:08 PM

## 2017-05-27 LAB — CBC
HEMATOCRIT: 29.9 % — AB (ref 36.0–46.0)
HEMOGLOBIN: 9.2 g/dL — AB (ref 12.0–15.0)
MCH: 30.8 pg (ref 26.0–34.0)
MCHC: 30.8 g/dL (ref 30.0–36.0)
MCV: 100 fL (ref 78.0–100.0)
PLATELETS: 350 10*3/uL (ref 150–400)
RBC: 2.99 MIL/uL — AB (ref 3.87–5.11)
RDW: 13.7 % (ref 11.5–15.5)
WBC: 13.8 10*3/uL — ABNORMAL HIGH (ref 4.0–10.5)

## 2017-05-27 NOTE — Progress Notes (Signed)
PROGRESS NOTE    Deborah Wallace  IDP:[id number] DOB: [birth year] DOA: 05/25/2017 PCP: System, Pcp Not In   Brief Narrative: Deborah Wallace is a 72 y.o. female with medical history significant of COPD. She presented with a COPD exacerbation. Currently improving but not at baseline.   Assessment & Plan:   Principal Problem:   COPD exacerbation ([place]) Active Problems:   Acute respiratory failure with hypoxia (HCC)   COPD exacerbation Acute respiratory failure with hypoxia On oxygen. Down to 3 litres. Not at baseline. Patient lives at home with daughter. Daughter is at home after 5pm. -Continue bronchodilators/steroids -Continue ceftriaxone/azithromycin -PT eval pending -wean oxygen/ambulate with pulse ox   DVT prophylaxis: Lovenox Code Status:   Code Status: Full Code Family Communication: None at bedside Disposition Plan: Discharge in 24 hours pending clinical improvement   Consultants:   None  Procedures:   None  Antimicrobials:  Ceftriaxone  Azithromycin    Subjective: Better overall but had some tachypnea this morning when she woke up.  Objective: Vitals:   05/26/17 1957 05/26/17 2237 05/27/17 0557 05/27/17 0804  BP:  (!) 104/47 (!) 113/52   Pulse:  72 70   Resp:      Temp:  97.8 F (36.6 C) (!) 97.5 F (36.4 C)   TempSrc:  Oral Oral   SpO2: 97% 99% 97% 97%  Weight:      Height:        Intake/Output Summary (Last 24 hours) at 05/27/2017 0812 Last data filed at 05/26/2017 2350 Gross per 24 hour  Intake 2350 ml  Output 200 ml  Net 2150 ml   Filed Weights   05/25/17 2231  Weight: 53.3 kg (117 lb 8.1 oz)    Examination:  General exam: Appears calm and comfortable Respiratory system: End expiratory wheezing. Prolonged expiratory phase. Respiratory effort normal. Cardiovascular system: S1 & S2 heard, RRR. No murmurs, rubs, gallops or clicks. Gastrointestinal system: Abdomen is nondistended, soft and nontender.  Normal bowel sounds  heard. Central nervous system: Alert and oriented. No focal neurological deficits. Extremities: No edema. No calf tenderness Skin: No cyanosis. No rashes Psychiatry: Judgement and insight appear normal. Mood & affect appropriate.     Data Reviewed: I have personally reviewed following labs and imaging studies  CBC: Recent Labs  Lab 05/25/17 1648 05/26/17 0348  WBC 23.0* 17.0*  HGB 12.5 10.5*  HCT 38.0 32.6*  MCV 97.4 99.1  PLT 499* 443   Basic Metabolic Panel: Recent Labs  Lab 05/25/17 1648 05/25/17 1709 05/26/17 0348  NA 138 139 139  K 3.9 3.8 3.7  CL 96* 98* 100*  CO2 27 27 30   GLUCOSE 197* 193* 201*  BUN 34* 34* 35*  CREATININE 0.99 1.01* 0.88  CALCIUM 9.6 9.4 8.5*   GFR: Estimated Creatinine Clearance: 42.4 mL/min (by C-G formula based on SCr of 0.88 mg/dL). Liver Function Tests: Recent Labs  Lab 05/25/17 1709  AST 17  ALT 10*  ALKPHOS 98  BILITOT 0.6  PROT 7.6  ALBUMIN 2.6*   No results for input(s): LIPASE, AMYLASE in the last 168 hours. No results for input(s): AMMONIA in the last 168 hours. Coagulation Profile: No results for input(s): INR, PROTIME in the last 168 hours. Cardiac Enzymes: No results for input(s): CKTOTAL, CKMB, CKMBINDEX, TROPONINI in the last 168 hours. BNP (last 3 results) No results for input(s): PROBNP in the last 8760 hours. HbA1C: No results for input(s): HGBA1C in the last 72 hours. CBG: No results for input(s): GLUCAP  in the last 168 hours. Lipid Profile: No results for input(s): CHOL, HDL, LDLCALC, TRIG, CHOLHDL, LDLDIRECT in the last 72 hours. Thyroid Function Tests: No results for input(s): TSH, T4TOTAL, FREET4, T3FREE, THYROIDAB in the last 72 hours. Anemia Panel: No results for input(s): VITAMINB12, FOLATE, FERRITIN, TIBC, IRON, RETICCTPCT in the last 72 hours. Sepsis Labs: Recent Labs  Lab 05/25/17 1710 05/25/17 2052  LATICACIDVEN 2.33* 1.48    Recent Results (from the past 240 hour(s))  Blood Culture  (routine x 2)     Status: None (Preliminary result)   Collection Time: 05/25/17  5:04 PM  Result Value Ref Range Status   Specimen Description BLOOD LEFT ANTECUBITAL  Final   Special Requests   Final    BOTTLES DRAWN AEROBIC AND ANAEROBIC Blood Culture adequate volume   Culture   Final    NO GROWTH < 24 HOURS Performed at [facility], [address], [place], [place] [address]    Report Status PENDING  Incomplete  Blood Culture (routine x 2)     Status: None (Preliminary result)   Collection Time: 05/25/17  5:09 PM  Result Value Ref Range Status   Specimen Description BLOOD BLOOD RIGHT WRIST  Final   Special Requests   Final    BOTTLES DRAWN AEROBIC AND ANAEROBIC Blood Culture adequate volume   Culture   Final    NO GROWTH < 24 HOURS Performed at [facility], [place] [address], [place], [place] [address]    Report Status PENDING  Incomplete  Urine culture     Status: None   Collection Time: 05/25/17  5:30 PM  Result Value Ref Range Status   Specimen Description URINE, RANDOM  Final   Special Requests NONE  Final   Culture   Final    NO GROWTH Performed at [facility], [place] [address] [place],  [address]    Report Status 05/26/2017 FINAL  Final         Radiology Studies: Dg Chest Port 1 View  Result Date: 05/25/2017 CLINICAL DATA:  Shortness of Breath for several days EXAM: PORTABLE CHEST 1 VIEW COMPARISON:  None. FINDINGS: Cardiac shadow is within normal limits. The lungs are well aerated bilaterally. Mild interstitial changes are seen without focal infiltrate or sizable effusion. No bony abnormality is noted. IMPRESSION: Diffuse interstitial changes are noted likely of a chronic nature. No acute infiltrate is seen. Electronically Signed   By: Inez Catalina M.D.   On: 05/25/2017 17:28        Scheduled Meds: . azithromycin  250 mg Oral Daily  . enoxaparin (LOVENOX) injection  40 mg Subcutaneous Q24H  . feeding supplement (ENSURE ENLIVE)  237 mL  Oral BID BM  . ipratropium-albuterol  3 mL Nebulization QID  . mometasone-formoterol  2 puff Inhalation BID  . nicotine  21 mg Transdermal Daily  . predniSONE  40 mg Oral Q breakfast   Continuous Infusions: . cefTRIAXone (ROCEPHIN)  IV Stopped (05/26/17 2305)     LOS: 2 days     Cordelia Poche, MD [facility] 05/27/2017, 8:12 AM Pager: [phone]  If 7PM-7AM, please contact night-coverage [email or web address] Password [facility] 05/27/2017, 8:12 AM

## 2017-05-27 NOTE — Progress Notes (Signed)
Initial Nutrition Assessment  DOCUMENTATION CODES:   Not applicable  INTERVENTION:    Continue Ensure Enlive po BID, each supplement provides 350 kcal and 20 grams of protein  NUTRITION DIAGNOSIS:   Increased nutrient needs related to chronic illness as evidenced by estimated needs  GOAL:   Patient will meet greater than or equal to 90% of their needs  MONITOR:   PO intake, Supplement acceptance, Labs, Skin, Weight trends  REASON FOR ASSESSMENT:   Consult COPD Protocol, Assessment of nutrition requirement/status  ASSESSMENT:   72 y.o. Female with PMH significant of COPD. Presented with a COPD exacerbation.   Pt sleeping upon RD visit. Name called x 2 with no response. Observed unopened Ensure Enlive supplement on tray table. PO intake poor at 20% per flowsheet records. Labs and medications reviewed. CBG's 197-193.  NUTRITION - FOCUSED PHYSICAL EXAM:  Unable to complete at this time.  Diet Order:  Diet Heart Room service appropriate? Yes; Fluid consistency: Thin  EDUCATION NEEDS:   Not appropriate for education at this time  Skin:  Skin Assessment: Reviewed RN Assessment  Last BM:  3/5  Height:   Ht Readings from Last 1 Encounters:  05/26/17 4\' 11"  (1.499 m)   Weight:   Wt Readings from Last 1 Encounters:  05/25/17 117 lb 8.1 oz (53.3 kg)   Ideal Body Weight:  44.6 kg  BMI:  Body mass index is 23.73 kg/m.  Estimated Nutritional Needs:   Kcal:  1500-1700  Protein:  70-85 gm  Fluid:  1.5-1.7 L  Arthur Holms, RD, LDN Pager #: [phone] After-Hours Pager #: [phone]

## 2017-05-27 NOTE — Evaluation (Signed)
Physical Therapy Evaluation Patient Details Name: Deborah Wallace MRN: [id number] DOB: 1946/03/02 Today's Date: 05/27/2017   History of Present Illness  Deborah Wallace is a 72 y.o. female with medical history significant of COPD. She presented with a COPD exacerbation.  Clinical Impression   Pt admitted with above diagnosis. Pt currently with functional limitations due to the deficits listed below (see PT Problem List). Presents with decr activity tolerance and decr functional capacity; Desatted with amb on Room Air, and reported dizziness with more time in standing activities; Will consider performing orthostatics next session; At this point, we must consider supplemental O2 for the home;  Pt will benefit from skilled PT to increase their independence and safety with mobility to allow discharge to the venue listed below.       Follow Up Recommendations Home health PT;Other (comment)(Consider [facility] for chronic disease management)    Equipment Recommendations  Other (comment)(Oxygen; will consider Rollator)    Recommendations for Other Services OT consult     Precautions / Restrictions Precautions Precautions: Other (comment) Precaution Comments: Desats quickly      Mobility  Bed Mobility Overal bed mobility: Needs Assistance Bed Mobility: Supine to Sit     Supine to sit: Supervision     General bed mobility comments: Overall moving to EOB well; cues to self-monitor for activity tolerance  Transfers Overall transfer level: Needs assistance Equipment used: None Transfers: Sit to/from Stand Sit to Stand: Min guard         General transfer comment: [place] for safety; Noted tending to reach out for UE support and stability  Ambulation/Gait Ambulation/Gait assistance: Min guard Ambulation Distance (Feet): 30 Feet Assistive device: None;Rolling walker (2 wheeled) Gait Pattern/deviations: Step-through pattern;Decreased step length - right;Decreased step length -  left;Decreased stride length     General Gait Details: Cues to self-monitor for activity tolerance; noted tending to reach out for UE support, so introduced RW for stability; Reported dizziness with more time upright standing (unable to obtain standing BP)  Stairs            Wheelchair Mobility    Modified Rankin (Stroke Patients Only)       Balance                                             Pertinent Vitals/Pain Pain Assessment: Faces Faces Pain Scale: Hurts a little bit Pain Location: grimace, chest pain when coughing Pain Descriptors / Indicators: Grimacing;Discomfort Pain Intervention(s): Monitored during session    Home Living Family/patient expects to be discharged to:: Private residence Living Arrangements: Children Available Help at Discharge: Family;Available PRN/intermittently(daughter works days) Type of Home: Mobile home Home Access: Stairs to enter Entrance Stairs-Rails: None Technical brewer of Steps: 2-3 Home Layout: One level Home Equipment: Environmental consultant - 2 wheels Additional Comments: Deborah Wallace is planning to move to an apartment in [place], [place] when she can    Prior Function Level of Independence: Independent;Needs assistance      ADL's / Homemaking Assistance Needed: does not drive; daughter completes IADLs        Hand Dominance        Extremity/Trunk Assessment   Upper Extremity Assessment Upper Extremity Assessment: Overall WFL for tasks assessed    Lower Extremity Assessment Lower Extremity Assessment: Generalized weakness       Communication   Communication: No difficulties  Cognition Arousal/Alertness: Awake/alert  Behavior During Therapy: WFL for tasks assessed/performed Overall Cognitive Status: Within Functional Limits for tasks assessed                                        General Comments General comments (skin integrity, edema, etc.): Desatted with amb on Room Air, see other note  of this date for walking O2 qualification     Exercises     Assessment/Plan    PT Assessment Patient needs continued PT services  PT Problem List Decreased strength;Decreased activity tolerance;Decreased balance;Decreased mobility;Decreased knowledge of use of DME;Decreased knowledge of precautions;Cardiopulmonary status limiting activity       PT Treatment Interventions DME instruction;Gait training;Stair training;Functional mobility training;Therapeutic activities;Therapeutic exercise;Balance training;Patient/family education    PT Goals (Current goals can be found in the Care Plan section)  Acute Rehab PT Goals Patient Stated Goal: Hopes to not need supplemental O2 PT Goal Formulation: With patient Time For Goal Achievement: 06/10/17 Potential to Achieve Goals: Good    Frequency Min 3X/week   Barriers to discharge Decreased caregiver support Must be modified independent to dc home    Co-evaluation               AM-PAC PT "6 Clicks" Daily Activity  Outcome Measure Difficulty turning over in bed (including adjusting bedclothes, sheets and blankets)?: A Little Difficulty moving from lying on back to sitting on the side of the bed? : A Little Difficulty sitting down on and standing up from a chair with arms (e.g., wheelchair, bedside commode, etc,.)?: A Little Help needed moving to and from a bed to chair (including a wheelchair)?: A Little Help needed walking in hospital room?: A Little Help needed climbing 3-5 steps with a railing? : A Lot 6 Click Score: 17    End of Session Equipment Utilized During Treatment: Gait belt;Oxygen Activity Tolerance: Patient tolerated treatment well;Other (comment)(though gait distance limited by dizziness) Patient left: in chair;with call bell/phone within reach;with chair alarm set Nurse Communication: Mobility status PT Visit Diagnosis: Unsteadiness on feet (R26.81);Other abnormalities of gait and mobility (R26.89);Other  (comment)(Decr functional capacity)    Time: 1346-1410 PT Time Calculation (min) (ACUTE ONLY): 24 min   Charges:   PT Evaluation $PT Eval Low Complexity: 1 Low PT Treatments $Gait Training: 8-22 mins   PT G Codes:        Roney Marion, PT  Acute Rehabilitation Services Pager [phone] Office [phone]   Colletta [place] 05/27/2017, 2:33 PM

## 2017-05-27 NOTE — Progress Notes (Signed)
Physical Therapy Treatment Note  Clinical Impression: SATURATION QUALIFICATIONS: (This note is used to comply with regulatory documentation for home oxygen)  Patient Saturations on Room Air at Rest = 90%  Patient Saturations on Room Air while Ambulating = 85%  Patient Saturations on 3 Liters of oxygen while Ambulating = 94%  Please briefly explain why patient needs home oxygen: Patient requires supplemental oxygen to maintain oxygen saturations at acceptable, safe levels with physical activity.  Roney Marion, [place]  Acute Rehabilitation Services Pager [phone] Office [phone]

## 2017-05-27 NOTE — Care Management Note (Addendum)
Case Management Note  Patient Details  Name: Deborah Wallace MRN: [id number] Date of Birth: January 24, 1946  Subjective/Objective:   Admitted for pneumonia                Action/Plan: In to speak with patient, Daughter Dawn at the bedside.  Permission given to speak in front of daughter.  Prior to admission patient just moved from [place] to [place] with daughter.  No PCP in the area yet.  Selected (2) providers per request for patient daughter to choose from to establish PCP.  Discussed recommendation for [place] post discharge.  Provided list to Daughter to look over and she will contact me with her choice when decided.  Also discussed recommendation of Oxygen post discharge.  They selected [facility] for oxygen services, advised we will need to arrange for delivery to patient room prior to discharge.   Patient denies inability to afford food or medications.  Patient has transportation home.  Home DME: Rollator, tub seat. Offered choice of [facility] PT to patient/daughter, they selected [facility]. Referral for [facility] PT called to Butch Penny with [facility]. NCM will continue to follow for discharge transition needs.  Expected Discharge Date:  05/29/17               Expected Discharge Plan:  [place]  In-House Referral:  Nutrition  Discharge planning Services  CM Consult  Post Acute Care Choice:    Choice offered to:   Daughter/Patient  Selected DME Agency:   [facility]  Status of Service:  In process, will continue to follow  Kristen Cardinal, RN  Nurse Case Manager [place] 05/27/2017, 4:37 PM

## 2017-05-28 DIAGNOSIS — R0789 Other chest pain: Secondary | ICD-10-CM

## 2017-05-28 LAB — BASIC METABOLIC PANEL
Anion gap: 8 (ref 5–15)
BUN: 29 mg/dL — AB (ref 6–20)
CALCIUM: 8.7 mg/dL — AB (ref 8.9–10.3)
CHLORIDE: 103 mmol/L (ref 101–111)
CO2: 30 mmol/L (ref 22–32)
CREATININE: 0.72 mg/dL (ref 0.44–1.00)
GFR calc Af Amer: >60 mL/min (ref >60)
GFR calc non Af Amer: >60 mL/min (ref >60)
GLUCOSE: 148 mg/dL — AB (ref 65–99)
Potassium: 3.9 mmol/L (ref 3.5–5.1)
Sodium: 141 mmol/L (ref 135–145)

## 2017-05-28 LAB — CBC
HCT: 33.9 % — ABNORMAL LOW (ref 36.0–46.0)
Hemoglobin: 10.9 g/dL — ABNORMAL LOW (ref 12.0–15.0)
MCH: 32.1 pg (ref 26.0–34.0)
MCHC: 32.2 g/dL (ref 30.0–36.0)
MCV: 99.7 fL (ref 78.0–100.0)
PLATELETS: 360 10*3/uL (ref 150–400)
RBC: 3.4 MIL/uL — ABNORMAL LOW (ref 3.87–5.11)
RDW: 13.8 % (ref 11.5–15.5)
WBC: 12.2 10*3/uL — ABNORMAL HIGH (ref 4.0–10.5)

## 2017-05-28 MED ORDER — GUAIFENESIN ER 600 MG PO TB12
1200.0000 mg | ORAL_TABLET | Freq: Two times a day (BID) | ORAL | Status: DC
  Administered 2017-05-28 – 2017-05-31 (×7): 1200 mg via ORAL
  Filled 2017-05-28 (×7): qty 2

## 2017-05-28 MED ORDER — METHYLPREDNISOLONE SODIUM SUCC 125 MG IJ SOLR
60.0000 mg | Freq: Two times a day (BID) | INTRAMUSCULAR | Status: DC
  Administered 2017-05-28 – 2017-05-29 (×3): 60 mg via INTRAVENOUS
  Filled 2017-05-28 (×3): qty 2

## 2017-05-28 NOTE — Progress Notes (Signed)
PROGRESS NOTE    Deborah Wallace  ACZ:[id number] DOB: [birth year] DOA: 05/25/2017 PCP: System, Pcp Not In   Brief Narrative: Deborah Wallace is a 72 y.o. female with medical history significant of COPD. She presented with a COPD exacerbation. Initially with improvement, but had set back with worsening symptoms. Not at baseline.   Assessment & Plan:   Principal Problem:   COPD exacerbation ([place]) Active Problems:   Acute respiratory failure with hypoxia (HCC)   COPD exacerbation Acute respiratory failure with hypoxia On oxygen. Down to 3 litres. Not at baseline. Patient lives at home with daughter. Daughter is at home after 5pm. Worsening dyspnea today with worsened wheezing from yesterday -Continue bronchodilators -Transition from prednisone to Solu-medrol -Continue ceftriaxone/azithromycin -PT eval: HH -wean oxygen/ambulate with pulse ox  Chest pain Likely secondary to significant dyspnea. EKG significant for PVCs. Symptoms improved with breathing treatment.   DVT prophylaxis: Lovenox Code Status:   Code Status: Full Code Family Communication: None at bedside Disposition Plan: Discharge in 24 hours pending clinical improvement   Consultants:   None  Procedures:   None  Antimicrobials:  Ceftriaxone  Azithromycin    Subjective: Dyspnea this morning with associated chest pain.  Objective: Vitals:   05/27/17 2031 05/27/17 2133 05/28/17 0629 05/28/17 0808  BP:  (!) 109/47 (!) 109/50   Pulse: 74 74 70   Resp: 16     Temp:  97.7 F (36.5 C) 98.2 F (36.8 C)   TempSrc:  Oral Oral   SpO2: 98% 98% 95% 96%  Weight:      Height:        Intake/Output Summary (Last 24 hours) at 05/28/2017 1003 Last data filed at 05/27/2017 2220 Gross per 24 hour  Intake 180 ml  Output 600 ml  Net -420 ml   Filed Weights   05/25/17 2231  Weight: 53.3 kg (117 lb 8.1 oz)    Examination:  General exam: Appears calm and comfortable Respiratory system: Diffuse wheezing  bilaterally with prolonged expiratory phase. Able to speak in complete sentences. Cardiovascular system: S1 & S2 heard, RRR. No murmurs, rubs, gallops or clicks. Gastrointestinal system: Abdomen is nondistended, soft and nontender.  Normal bowel sounds heard. Central nervous system: Alert and oriented. No focal neurological deficits. Extremities: No edema. No calf tenderness Skin: No cyanosis. No rashes Psychiatry: Judgement and insight appear normal. Mood & affect appropriate.     Data Reviewed: I have personally reviewed following labs and imaging studies  CBC: Recent Labs  Lab 05/25/17 1648 05/26/17 0348 05/27/17 0735 05/28/17 0816  WBC 23.0* 17.0* 13.8* 12.2*  HGB 12.5 10.5* 9.2* 10.9*  HCT 38.0 32.6* 29.9* 33.9*  MCV 97.4 99.1 100.0 99.7  PLT 499* 363 350 109   Basic Metabolic Panel: Recent Labs  Lab 05/25/17 1648 05/25/17 1709 05/26/17 0348 05/28/17 0816  NA 138 139 139 141  K 3.9 3.8 3.7 3.9  CL 96* 98* 100* 103  CO2 27 27 30 30   GLUCOSE 197* 193* 201* 148*  BUN 34* 34* 35* 29*  CREATININE 0.99 1.01* 0.88 0.72  CALCIUM 9.6 9.4 8.5* 8.7*   GFR: Estimated Creatinine Clearance: 46.7 mL/min (by C-G formula based on SCr of 0.72 mg/dL). Liver Function Tests: Recent Labs  Lab 05/25/17 1709  AST 17  ALT 10*  ALKPHOS 98  BILITOT 0.6  PROT 7.6  ALBUMIN 2.6*   No results for input(s): LIPASE, AMYLASE in the last 168 hours. No results for input(s): AMMONIA in the last 168 hours.  Coagulation Profile: No results for input(s): INR, PROTIME in the last 168 hours. Cardiac Enzymes: No results for input(s): CKTOTAL, CKMB, CKMBINDEX, TROPONINI in the last 168 hours. BNP (last 3 results) No results for input(s): PROBNP in the last 8760 hours. HbA1C: No results for input(s): HGBA1C in the last 72 hours. CBG: No results for input(s): GLUCAP in the last 168 hours. Lipid Profile: No results for input(s): CHOL, HDL, LDLCALC, TRIG, CHOLHDL, LDLDIRECT in the last 72  hours. Thyroid Function Tests: No results for input(s): TSH, T4TOTAL, FREET4, T3FREE, THYROIDAB in the last 72 hours. Anemia Panel: No results for input(s): VITAMINB12, FOLATE, FERRITIN, TIBC, IRON, RETICCTPCT in the last 72 hours. Sepsis Labs: Recent Labs  Lab 05/25/17 1710 05/25/17 2052  LATICACIDVEN 2.33* 1.48    Recent Results (from the past 240 hour(s))  Blood Culture (routine x 2)     Status: None (Preliminary result)   Collection Time: 05/25/17  5:04 PM  Result Value Ref Range Status   Specimen Description BLOOD LEFT ANTECUBITAL  Final   Special Requests   Final    BOTTLES DRAWN AEROBIC AND ANAEROBIC Blood Culture adequate volume   Culture   Final    NO GROWTH 2 DAYS Performed at [facility], [address] [place], [place] [address]    Report Status PENDING  Incomplete  Blood Culture (routine x 2)     Status: None (Preliminary result)   Collection Time: 05/25/17  5:09 PM  Result Value Ref Range Status   Specimen Description BLOOD BLOOD RIGHT WRIST  Final   Special Requests   Final    BOTTLES DRAWN AEROBIC AND ANAEROBIC Blood Culture adequate volume   Culture   Final    NO GROWTH 2 DAYS Performed at [facility], [place] [address], [place], [place] [address]    Report Status PENDING  Incomplete  Urine culture     Status: None   Collection Time: 05/25/17  5:30 PM  Result Value Ref Range Status   Specimen Description URINE, RANDOM  Final   Special Requests NONE  Final   Culture   Final    NO GROWTH Performed at [facility], [place] [address] [place], [place] [address]    Report Status 05/26/2017 FINAL  Final         Radiology Studies: No results found.      Scheduled Meds: . azithromycin  250 mg Oral Daily  . enoxaparin (LOVENOX) injection  40 mg Subcutaneous Q24H  . feeding supplement (ENSURE ENLIVE)  237 mL Oral BID BM  . guaiFENesin  1,200 mg Oral BID  . ipratropium-albuterol  3 mL Nebulization QID  . methylPREDNISolone  (SOLU-MEDROL) injection  60 mg Intravenous Q12H  . mometasone-formoterol  2 puff Inhalation BID  . nicotine  21 mg Transdermal Daily   Continuous Infusions: . cefTRIAXone (ROCEPHIN)  IV Stopped (05/27/17 2220)     LOS: 3 days     Cordelia Poche, MD [facility] 05/28/2017, 10:03 AM Pager: [phone]  If 7PM-7AM, please contact night-coverage [email or web address] Password [facility] 05/28/2017, 10:03 AM

## 2017-05-28 NOTE — Progress Notes (Signed)
Physical Therapy Treatment Patient Details Name: Deborah Wallace MRN: [id number] DOB: Jun 13, 1945 Today's Date: 05/28/2017    History of Present Illness Deborah Wallace is a 72 y.o. female with medical history significant of COPD. She presented with a COPD exacerbation.    PT Comments    Pt reports she felt worse upon waking today.  Still needs O2, even at rest as her breathing is still shallow.  Emphasis on gait stamina and stability.   Follow Up Recommendations  Home health PT;Other (comment)     Equipment Recommendations  (will need O2 initially)    Recommendations for Other Services       Precautions / Restrictions Precautions Precautions: Other (comment)    Mobility  Bed Mobility Overal bed mobility: Needs Assistance Bed Mobility: Supine to Sit     Supine to sit: Supervision     General bed mobility comments: moved to EOB with ease  Transfers Overall transfer level: Needs assistance Equipment used: Rolling walker (2 wheeled);None Transfers: Sit to/from Stand Sit to Stand: Min guard         General transfer comment: cues for hand placement and then pt very safe  Ambulation/Gait Ambulation/Gait assistance: Min guard Ambulation Distance (Feet): 25 Feet(then addt 60 feet and 45 feet) Assistive device: Rolling walker (2 wheeled) Gait Pattern/deviations: Step-through pattern Gait velocity: slower   General Gait Details: cues for better use of the RW.  cues for efficient breathing   Stairs            Wheelchair Mobility    Modified Rankin (Stroke Patients Only)       Balance Overall balance assessment: Needs assistance   Sitting balance-Leahy Scale: Good       Standing balance-Leahy Scale: Fair                              Cognition Arousal/Alertness: Awake/alert Behavior During Therapy: WFL for tasks assessed/performed Overall Cognitive Status: Within Functional Limits for tasks assessed                                         Exercises      General Comments General comments (skin integrity, edema, etc.): desatted on RA to 88% at 84 bpm at rest, On 3L [place] with gait sats dropped to 90% and 90 bpm  1st trial and 92% at 85 bpm 2 trial when coaching for more efficient breathing.      Pertinent Vitals/Pain Pain Assessment: Faces Faces Pain Scale: No hurt    Home Living                      Prior Function            PT Goals (current goals can now be found in the care plan section) Acute Rehab PT Goals Patient Stated Goal: Hopes to not need supplemental O2 PT Goal Formulation: With patient Time For Goal Achievement: 06/10/17 Potential to Achieve Goals: Good Progress towards PT goals: Progressing toward goals    Frequency    Min 3X/week      PT Plan Current plan remains appropriate    Co-evaluation              AM-PAC PT "6 Clicks" Daily Activity  Outcome Measure  Difficulty turning over in bed (including adjusting bedclothes, sheets and blankets)?: A  Little Difficulty moving from lying on back to sitting on the side of the bed? : A Little Difficulty sitting down on and standing up from a chair with arms (e.g., wheelchair, bedside commode, etc,.)?: A Little Help needed moving to and from a bed to chair (including a wheelchair)?: A Little Help needed walking in hospital room?: A Little Help needed climbing 3-5 steps with a railing? : A Little 6 Click Score: 18    End of Session Equipment Utilized During Treatment: Oxygen   Patient left: in bed;with call bell/phone within reach;with family/visitor present Nurse Communication: Mobility status PT Visit Diagnosis: Unsteadiness on feet (R26.81);Other abnormalities of gait and mobility (R26.89)     Time: 6950-7225 PT Time Calculation (min) (ACUTE ONLY): 23 min  Charges:  $Gait Training: 8-22 mins $Therapeutic Activity: 8-22 mins                    G Codes:       2017/06/10  Donnella Sham,  PT [phone]  (pager)   Tessie Fass Blaire Palomino 06-10-17, 4:48 PM

## 2017-05-29 MED ORDER — DOXYCYCLINE HYCLATE 100 MG PO TABS
100.0000 mg | ORAL_TABLET | Freq: Two times a day (BID) | ORAL | Status: DC
  Administered 2017-05-29 – 2017-05-31 (×5): 100 mg via ORAL
  Filled 2017-05-29 (×5): qty 1

## 2017-05-29 MED ORDER — ALBUTEROL SULFATE (2.5 MG/3ML) 0.083% IN NEBU: 2.5000 mg | INHALATION_SOLUTION | RESPIRATORY_TRACT | Status: DC | PRN

## 2017-05-29 MED ORDER — METHYLPREDNISOLONE SODIUM SUCC 40 MG IJ SOLR
40.0000 mg | Freq: Two times a day (BID) | INTRAMUSCULAR | Status: DC
  Administered 2017-05-29: 40 mg via INTRAVENOUS
  Filled 2017-05-29: qty 1

## 2017-05-29 MED ORDER — IPRATROPIUM-ALBUTEROL 0.5-2.5 (3) MG/3ML IN SOLN
3.0000 mL | Freq: Two times a day (BID) | RESPIRATORY_TRACT | Status: DC
  Administered 2017-05-29 – 2017-05-31 (×4): 3 mL via RESPIRATORY_TRACT
  Filled 2017-05-29 (×4): qty 3

## 2017-05-29 NOTE — Progress Notes (Signed)
Pt states that she is too tired to ambulate again at this time.  On 3L/min [place] at this time.  Decreased O2 to 2L/min [place] and will recheck O2 sats in a little while to see if maintains.

## 2017-05-29 NOTE — Progress Notes (Signed)
SATURATION QUALIFICATIONS: (This note is used to comply with regulatory documentation for home oxygen)  Patient Saturations on Room Air at Rest = 91%  Patient Saturations on Room Air while Ambulating = 86%  Patient Saturations on  Liters of oxygen while Ambulating = %  Pt refused at this time stating she is just too tired to do this at this time.  Please briefly explain why patient needs home oxygen:  O2 sats decrease to 86% on RA while ambulating.  Pt refused to ambulate on O2 stating she was too tired.

## 2017-05-29 NOTE — Progress Notes (Addendum)
[facility] PROGRESS NOTE  JERLENE ROCKERS KVQ:[id number] DOB: 02-11-1946 DOA: 06/06/17  PCP: System, Pcp Not In  Brief History/Interval Summary: Deborah Wallace is a 72 y.o. female with medical history significant of COPD. She presented with a COPD exacerbation. Initially with improvement, but had set back with worsening symptoms. Not at baseline.  Reason for Visit: Acute COPD exacerbation  Consultants: None  Procedures: None  Antibiotics: Currently on ceftriaxone and azithromycin.  We will change her over to doxycycline for a few more days.  Subjective/Interval History: Patient states that she is feeling better today compared to yesterday.  But however still not back to her baseline in terms of her breathing.  Does not use oxygen at home.  Denies any chest pain.  ROS: Denies any nausea or vomiting  Objective:  Vital Signs  Vitals:   05/28/17 2230 05/29/17 0521 05/29/17 0745 05/29/17 1142  BP: 111/73 (!) 124/58    Pulse: 75 68    Resp: 16 18    Temp: 98.2 F (36.8 C) 98.1 F (36.7 C)    TempSrc: Oral Oral    SpO2: 98% 100% 99% 97%  Weight:      Height:        Intake/Output Summary (Last 24 hours) at 05/29/2017 1220 Last data filed at 05/29/2017 0930 Gross per 24 hour  Intake 100 ml  Output 100 ml  Net 0 ml   Filed Weights   06/06/17 2231  Weight: 53.3 kg (117 lb 8.1 oz)    General appearance: alert, cooperative, appears stated age and no distress Resp: Mildly tachypneic at rest.  Continues to have wheezing bilaterally though she does have good air entry.  No crackles.  No rhonchi. Cardio: regular rate and rhythm, S1, S2 normal, no murmur, click, rub or gallop GI: soft, non-tender; bowel sounds normal; no masses,  no organomegaly Extremities: extremities normal, atraumatic, no cyanosis or edema Neurologic: No focal neurological deficits.  Lab Results:  Data Reviewed: I have personally reviewed following labs and imaging studies  CBC: Recent Labs    Lab Jun 06, 2017 1648 05/26/17 0348 05/27/17 0735 05/28/17 0816  WBC 23.0* 17.0* 13.8* 12.2*  HGB 12.5 10.5* 9.2* 10.9*  HCT 38.0 32.6* 29.9* 33.9*  MCV 97.4 99.1 100.0 99.7  PLT 499* 363 350 643    Basic Metabolic Panel: Recent Labs  Lab 06/06/17 1648 06-Jun-2017 1709 05/26/17 0348 05/28/17 0816  NA 138 139 139 141  K 3.9 3.8 3.7 3.9  CL 96* 98* 100* 103  CO2 27 27 30 30   GLUCOSE 197* 193* 201* 148*  BUN 34* 34* 35* 29*  CREATININE 0.99 1.01* 0.88 0.72  CALCIUM 9.6 9.4 8.5* 8.7*    GFR: Estimated Creatinine Clearance: 46.7 mL/min (by C-G formula based on SCr of 0.72 mg/dL).  Liver Function Tests: Recent Labs  Lab 2017/06/06 1709  AST 17  ALT 10*  ALKPHOS 98  BILITOT 0.6  PROT 7.6  ALBUMIN 2.6*     Recent Results (from the past 240 hour(s))  Blood Culture (routine x 2)     Status: None (Preliminary result)   Collection Time: 06/06/17  5:04 PM  Result Value Ref Range Status   Specimen Description BLOOD LEFT ANTECUBITAL  Final   Special Requests   Final    BOTTLES DRAWN AEROBIC AND ANAEROBIC Blood Culture adequate volume   Culture   Final    NO GROWTH 3 DAYS Performed at [facility], [address], [place], [place] [address]  Report Status PENDING  Incomplete  Blood Culture (routine x 2)     Status: None (Preliminary result)   Collection Time: 05/25/17  5:09 PM  Result Value Ref Range Status   Specimen Description BLOOD BLOOD RIGHT WRIST  Final   Special Requests   Final    BOTTLES DRAWN AEROBIC AND ANAEROBIC Blood Culture adequate volume   Culture   Final    NO GROWTH 3 DAYS Performed at [facility], [address], [place], [place] [address]    Report Status PENDING  Incomplete  Urine culture     Status: None   Collection Time: 05/25/17  5:30 PM  Result Value Ref Range Status   Specimen Description URINE, RANDOM  Final   Special Requests NONE  Final   Culture   Final    NO GROWTH Performed at [facility], [place] [address], [place], [place] [address]    Report Status 05/26/2017 FINAL  Final      Radiology Studies: No results found.   Medications:  Scheduled: . enoxaparin (LOVENOX) injection  40 mg Subcutaneous Q24H  . feeding supplement (ENSURE ENLIVE)  237 mL Oral BID BM  . guaiFENesin  1,200 mg Oral BID  . ipratropium-albuterol  3 mL Nebulization QID  . methylPREDNISolone (SOLU-MEDROL) injection  60 mg Intravenous Q12H  . mometasone-formoterol  2 puff Inhalation BID  . nicotine  21 mg Transdermal Daily   Continuous: . cefTRIAXone (ROCEPHIN)  IV Stopped (05/28/17 2215)   GXQ:JJHERDEYCXKGY **OR** acetaminophen, albuterol, ondansetron **OR** ondansetron (ZOFRAN) IV  Assessment/Plan:  Principal Problem:   COPD exacerbation ([place]) Active Problems:   Acute respiratory failure with hypoxia (HCC)    Acute respiratory failure with hypoxia Secondary to COPD exacerbation.  She is currently on 2 L of oxygen by nasal cannula.  Will need to check room air saturations and saturations with ambulation prior to discharge.  Yesterday her oxygen saturations did drop to 88% at rest.  Likely she will need home oxygen.  COPD exacerbation appears to be improving.  Influenza PCR negative.  Acute COPD exacerbation She experienced worsening symptoms yesterday morning.  She was transitioned from prednisone to Solu-Medrol.  Continue nebulizer treatments.  She is on ceftriaxone and azithromycin.  No acute infiltrates noted on chest x-ray.  Change to oral doxycycline.  Wean down on steroids.  Chest pain Secondary to her respiratory issues.  EKG without any ischemic changes.  PVC noted.  Symptoms have improved.  Normocytic anemia Hemoglobin is stable.  No evidence for overt bleeding.  DVT Prophylaxis: Lovenox    Code Status: Full code Family Communication: Discussed with the patient Disposition Plan: Mobilize.  Check room air saturations with ambulation.  Cut back on steroids today.  Anticipate discharge in the next  24-48 hours.    LOS: 4 days   [place] Pager [phone] 05/29/2017, 12:20 PM  If 7PM-7AM, please contact night-coverage at [email or web address], password [facility]

## 2017-05-29 NOTE — Plan of Care (Signed)
  Clinical Measurements: Respiratory complications will improve 05/29/2017 0450 - Progressing by Kemiya Batdorf, [place], RN Note Pt did well overnight,  no wheezing noted upon auscultation. Pt did well when ambulating to the bathroom and transferring back and forth to the BCS.

## 2017-05-29 NOTE — Care Management Important Message (Signed)
Important Message  Patient Details  Name: Deborah Wallace MRN: [id number] Date of Birth: [birth year]   Medicare Important Message Given:  Yes    Orbie Pyo 05/29/2017, 1:18 PM

## 2017-05-29 NOTE — Progress Notes (Addendum)
Patient states she would like to get home oxygen with [employer], because her daughter n law works with [employer].  NCM will make referral to [facility].  As soon as ambulatory sats are put in the system that shows she qualifies for the oxygen NCM will fax to [facility] is now [facility]) fax is [phone].

## 2017-05-30 DIAGNOSIS — J441 Chronic obstructive pulmonary disease with (acute) exacerbation: Principal | ICD-10-CM

## 2017-05-30 LAB — CBC
HCT: 34.7 % — ABNORMAL LOW (ref 36.0–46.0)
Hemoglobin: 11.1 g/dL — ABNORMAL LOW (ref 12.0–15.0)
MCH: 30.8 pg (ref 26.0–34.0)
MCHC: 32 g/dL (ref 30.0–36.0)
MCV: 96.4 fL (ref 78.0–100.0)
PLATELETS: 344 10*3/uL (ref 150–400)
RBC: 3.6 MIL/uL — ABNORMAL LOW (ref 3.87–5.11)
RDW: 13.2 % (ref 11.5–15.5)
WBC: 11.5 10*3/uL — AB (ref 4.0–10.5)

## 2017-05-30 LAB — CULTURE, BLOOD (ROUTINE X 2)
Culture: NO GROWTH
Culture: NO GROWTH
SPECIAL REQUESTS: ADEQUATE
Special Requests: ADEQUATE

## 2017-05-30 LAB — BASIC METABOLIC PANEL
ANION GAP: 9 (ref 5–15)
BUN: 29 mg/dL — ABNORMAL HIGH (ref 6–20)
CALCIUM: 8.7 mg/dL — AB (ref 8.9–10.3)
CO2: 31 mmol/L (ref 22–32)
Chloride: 97 mmol/L — ABNORMAL LOW (ref 101–111)
Creatinine, Ser: 0.72 mg/dL (ref 0.44–1.00)
GFR calc Af Amer: >60 mL/min (ref >60)
Glucose, Bld: 184 mg/dL — ABNORMAL HIGH (ref 65–99)
Potassium: 4.9 mmol/L (ref 3.5–5.1)
SODIUM: 137 mmol/L (ref 135–145)

## 2017-05-30 MED ORDER — PREDNISONE 20 MG PO TABS
50.0000 mg | ORAL_TABLET | Freq: Every day | ORAL | Status: DC
  Administered 2017-05-30 – 2017-05-31 (×2): 50 mg via ORAL
  Filled 2017-05-30: qty 2

## 2017-05-30 NOTE — Progress Notes (Signed)
[facility] PROGRESS NOTE  Deborah Wallace [id number]:[id number] DOB: 1946/02/18 DOA: 05/25/2017  PCP: System, Pcp Not In  Brief History/Interval Summary: Deborah Wallace is a 72 y.o. female with medical history significant of COPD. She presented with a COPD exacerbation. Initially with improvement, but had set back with worsening symptoms. Not at baseline.  Reason for Visit: Acute COPD exacerbation  Consultants: None  Procedures: None  Antibiotics: Initially on ceftriaxone and azithromycin.  We will change her over to doxycycline for a few more days.  Subjective/Interval History: Was not feeling good, continues to have shortness of breath and cough.  No chest pain  ROS: Denies any nausea or vomiting  Objective:  Vital Signs  Vitals:   05/30/17 0604 05/30/17 0759 05/30/17 0800 05/30/17 1450  BP:    112/64  Pulse:    78  Resp:    18  Temp:    98.4 F (36.9 C)  TempSrc:    Oral  SpO2:  96% 96% 96%  Weight: 56.8 kg (125 lb 3.5 oz)     Height:        Intake/Output Summary (Last 24 hours) at 05/30/2017 1724 Last data filed at 05/30/2017 1633 Gross per 24 hour  Intake 480 ml  Output 900 ml  Net -420 ml   Filed Weights   05/25/17 2231 05/30/17 0604  Weight: 53.3 kg (117 lb 8.1 oz) 56.8 kg (125 lb 3.5 oz)    General appearance: alert, cooperative, appears stated age and no distress Resp: Mildly tachypneic at rest.  Continues to have wheezing bilaterally though she does have good air entry.  No crackles.  No rhonchi. Cardio: regular rate and rhythm, S1, S2 normal, no murmur, click, rub or gallop GI: soft, non-tender; bowel sounds normal; no masses,  no organomegaly Extremities: extremities normal, atraumatic, no cyanosis or edema Neurologic: No focal neurological deficits.  Lab Results:  Data Reviewed: I have personally reviewed following labs and imaging studies  CBC: Recent Labs  Lab 05/25/17 1648 05/26/17 0348 05/27/17 0735 05/28/17 0816 05/30/17 0214  WBC  23.0* 17.0* 13.8* 12.2* 11.5*  HGB 12.5 10.5* 9.2* 10.9* 11.1*  HCT 38.0 32.6* 29.9* 33.9* 34.7*  MCV 97.4 99.1 100.0 99.7 96.4  PLT 499* 363 350 360 527    Basic Metabolic Panel: Recent Labs  Lab 05/25/17 1648 05/25/17 1709 05/26/17 0348 05/28/17 0816 05/30/17 0214  NA 138 139 139 141 137  K 3.9 3.8 3.7 3.9 4.9  CL 96* 98* 100* 103 97*  CO2 27 27 30 30 31   GLUCOSE 197* 193* 201* 148* 184*  BUN 34* 34* 35* 29* 29*  CREATININE 0.99 1.01* 0.88 0.72 0.72  CALCIUM 9.6 9.4 8.5* 8.7* 8.7*    GFR: Estimated Creatinine Clearance: 48.1 mL/min (by C-G formula based on SCr of 0.72 mg/dL).  Liver Function Tests: Recent Labs  Lab 05/25/17 1709  AST 17  ALT 10*  ALKPHOS 98  BILITOT 0.6  PROT 7.6  ALBUMIN 2.6*     Recent Results (from the past 240 hour(s))  Blood Culture (routine x 2)     Status: None   Collection Time: 05/25/17  5:04 PM  Result Value Ref Range Status   Specimen Description BLOOD LEFT ANTECUBITAL  Final   Special Requests   Final    BOTTLES DRAWN AEROBIC AND ANAEROBIC Blood Culture adequate volume   Culture   Final    NO GROWTH 5 DAYS Performed at [facility], [address] [place], [place]  [address]    Report Status 05/30/2017 FINAL  Final  Blood Culture (routine x 2)     Status: None   Collection Time: 05/25/17  5:09 PM  Result Value Ref Range Status   Specimen Description BLOOD BLOOD RIGHT WRIST  Final   Special Requests   Final    BOTTLES DRAWN AEROBIC AND ANAEROBIC Blood Culture adequate volume   Culture   Final    NO GROWTH 5 DAYS Performed at [facility], [place] [address], [place], [place] [address]    Report Status 05/30/2017 FINAL  Final  Urine culture     Status: None   Collection Time: 05/25/17  5:30 PM  Result Value Ref Range Status   Specimen Description URINE, RANDOM  Final   Special Requests NONE  Final   Culture   Final    NO GROWTH Performed at [facility], [place] [address] [place] [place], [place] [address]     Report Status 05/26/2017 FINAL  Final      Radiology Studies: No results found.   Medications:  Scheduled: . doxycycline  100 mg Oral Q12H  . enoxaparin (LOVENOX) injection  40 mg Subcutaneous Q24H  . feeding supplement (ENSURE ENLIVE)  237 mL Oral BID BM  . guaiFENesin  1,200 mg Oral BID  . ipratropium-albuterol  3 mL Nebulization BID  . mometasone-formoterol  2 puff Inhalation BID  . nicotine  21 mg Transdermal Daily  . predniSONE  50 mg Oral Q breakfast   Continuous:  EEF:EOFHQRFXJOITG **OR** acetaminophen, albuterol, ondansetron **OR** ondansetron (ZOFRAN) IV  Assessment/Plan:  Principal Problem:   COPD exacerbation (HCC) Active Problems:   Acute respiratory failure with hypoxia (HCC)    Acute respiratory failure with hypoxia Secondary to COPD exacerbation.  Per notes from PT patient required 6 L of oxygen on ambulation, appears to be documentation Ater. Monitor. Use incentive spirometry minimize oxygen on discharge. Likely she will need home oxygen.  COPD exacerbation appears to be improving.  Influenza PCR negative.  Acute COPD exacerbation She experienced worsening symptoms yesterday morning.  She was transitioned from prednisone to Solu-Medrol.  Continue nebulizer treatments.  She is on ceftriaxone and azithromycin.  No acute infiltrates noted on chest x-ray.  Change to oral doxycycline.  Wean down on steroids.  Chest pain Secondary to her respiratory issues.  EKG without any ischemic changes.  PVC noted.  Symptoms have improved.  Normocytic anemia Hemoglobin is stable.  No evidence for overt bleeding.  DVT Prophylaxis: Lovenox    Code Status: Full code Family Communication: Discussed with the patient Disposition Plan: Mobilize.  Check room air saturations with ambulation.  Cut back on steroids today.  Anticipate discharge in the next 24-48 hours.    LOS: 5 days   [place] Pager [phone] 05/30/2017, 5:24 PM  If 7PM-7AM,  please contact night-coverage at [email or web address], password [facility]

## 2017-05-30 NOTE — Progress Notes (Signed)
Physical Therapy Treatment Patient Details Name: Deborah Wallace MRN: [id number] DOB: [birth year] Today's Date: 05/30/2017    History of Present Illness Deborah Wallace is a 72 y.o. female with medical history significant of COPD. She presented with a COPD exacerbation.    PT Comments    Pt admitted with above diagnosis. Pt currently with functional limitations due to the deficits listed below (see PT Problem List). Pt was able to ambulate with RW but did desat and need up to 6LO2 with activity.  Does not require a lot of physical assist but does need incr O2 with activity.  Will need 24 hour care as well.  Pt states she has 24 hour care.  Also will need pulse oximeter.   Pt will benefit from skilled PT to increase their indepSATURATION QUALIFICATIONS: (This note is used to comply with regulatory documentation for home oxygen)  Patient Saturations on Room Air at Rest = 85%  Patient Saturations on Room Air while Ambulating = 82%  Patient Saturations on 6 Liters of oxygen while Ambulating = 90%  Please briefly explain why patient needs home oxygen:Pt requires O2 for at rest and with ambulation to keep sats above 90%.  endence and safety with mobility to allow discharge to the venue listed below.      Follow Up Recommendations  Home health PT;Other (comment)     Equipment Recommendations  (will need O2, pulse oximeter)    Recommendations for Other Services OT consult     Precautions / Restrictions Precautions Precautions: Other (comment) Precaution Comments: Desats quickly Restrictions Weight Bearing Restrictions: No    Mobility  Bed Mobility Overal bed mobility: Needs Assistance Bed Mobility: Supine to Sit     Supine to sit: Supervision     General bed mobility comments: moved to EOB without assist but did have incr SOB.  Pt first transferred to 3N1 and was able to clean herself after toileting.  DOE 3/4 and needed to rest prior to walk.   Transfers Overall transfer  level: Needs assistance Equipment used: Rolling walker (2 wheeled);None Transfers: Sit to/from Stand Sit to Stand: Min guard         General transfer comment: cues for hand placement and then pt very safe  Ambulation/Gait Ambulation/Gait assistance: Min guard Ambulation Distance (Feet): 110 Feet Assistive device: Rolling walker (2 wheeled) Gait Pattern/deviations: Step-through pattern Gait velocity: slower Gait velocity interpretation: Below normal speed for age/gender General Gait Details: cues for better use of the RW.  cues for efficient breathing   Stairs            Wheelchair Mobility    Modified Rankin (Stroke Patients Only)       Balance Overall balance assessment: Needs assistance Sitting-balance support: No upper extremity supported;Feet supported Sitting balance-Leahy Scale: Good     Standing balance support: Bilateral upper extremity supported;During functional activity Standing balance-Leahy Scale: Fair Standing balance comment: able to stand at sink and wash hands without UE support.                             Cognition Arousal/Alertness: Awake/alert Behavior During Therapy: WFL for tasks assessed/performed Overall Cognitive Status: Within Functional Limits for tasks assessed                                        Exercises  General Comments General comments (skin integrity, edema, etc.): Supine 136/63, HR 86 bpm; sitting 129/62, 87 bpm; standing 136/66 with HR 87 bpm.  Sats fluctuating depending on pts exertion.  At rest on 2L, sats 91 %.  With activity on 2L desat to 87%.  Sats on RA at rest 85% and RA with activity 80 %.  At times needing 6L with activity to keep sats above 90%.  Instructed in pursed lip breathing and pt needs reinforcement.        Pertinent Vitals/Pain Pain Assessment: Faces Faces Pain Scale: Hurts little more Pain Location: grimace, chest pain when coughing Pain Descriptors / Indicators:  Grimacing;Discomfort Pain Intervention(s): Limited activity within patient's tolerance;Monitored during session;Repositioned    Home Living                      Prior Function            PT Goals (current goals can now be found in the care plan section) Acute Rehab PT Goals Patient Stated Goal: Hopes to not need supplemental O2 Progress towards PT goals: Progressing toward goals    Frequency    Min 3X/week      PT Plan Current plan remains appropriate    Co-evaluation              AM-PAC PT "6 Clicks" Daily Activity  Outcome Measure  Difficulty turning over in bed (including adjusting bedclothes, sheets and blankets)?: None Difficulty moving from lying on back to sitting on the side of the bed? : None Difficulty sitting down on and standing up from a chair with arms (e.g., wheelchair, bedside commode, etc,.)?: A Little Help needed moving to and from a bed to chair (including a wheelchair)?: A Little Help needed walking in hospital room?: A Little Help needed climbing 3-5 steps with a railing? : A Lot 6 Click Score: 19    End of Session Equipment Utilized During Treatment: Oxygen;Gait belt Activity Tolerance: Patient limited by fatigue(limited by DOE 3/4 and desaturation with activity) Patient left: with call bell/phone within reach;in chair;with chair alarm set Nurse Communication: Mobility status PT Visit Diagnosis: Unsteadiness on feet (R26.81);Other abnormalities of gait and mobility (R26.89)     Time: 8891-6945 PT Time Calculation (min) (ACUTE ONLY): 26 min  Charges:  $Gait Training: 8-22 mins $Self Care/Home Management: 8-22                    G Codes:       Taetum Flewellen,PT Acute Rehabilitation [phone] (pager)    Denice Paradise 05/30/2017, 9:51 AM

## 2017-05-30 NOTE — Progress Notes (Signed)
SATURATION QUALIFICATIONS: (This note is used to comply with regulatory documentation for home oxygen)  Patient Saturations on Room Air at Rest = 85%  Patient Saturations on Room Air while Ambulating = 82%  Patient Saturations on 6 Liters of oxygen while Ambulating = 90%  Please briefly explain why patient needs home oxygen:Pt requires O2 for at rest and with ambulation to keep sats above 90%.  Thanks. [place] [phone] (pager)

## 2017-05-31 DIAGNOSIS — E440 Moderate protein-calorie malnutrition: Secondary | ICD-10-CM

## 2017-05-31 LAB — MAGNESIUM: MAGNESIUM: 1.9 mg/dL (ref 1.7–2.4)

## 2017-05-31 MED ORDER — NICOTINE 21 MG/24HR TD PT24: 21.0000 mg | MEDICATED_PATCH | Freq: Every day | TRANSDERMAL | 0 refills | Status: DC

## 2017-05-31 MED ORDER — ENSURE ENLIVE PO LIQD: 237.0000 mL | Freq: Two times a day (BID) | ORAL | 12 refills | Status: DC

## 2017-05-31 MED ORDER — DEXTROMETHORPHAN POLISTIREX ER 30 MG/5ML PO SUER
15.0000 mg | Freq: Two times a day (BID) | ORAL | Status: DC
  Filled 2017-05-31 (×2): qty 5

## 2017-05-31 MED ORDER — PREDNISONE 10 MG PO TABS: ORAL_TABLET | ORAL | 0 refills | Status: DC

## 2017-05-31 MED ORDER — DOXYCYCLINE HYCLATE 100 MG PO TABS: 100.0000 mg | ORAL_TABLET | Freq: Two times a day (BID) | ORAL | 0 refills | Status: AC

## 2017-05-31 MED ORDER — MOMETASONE FURO-FORMOTEROL FUM 200-5 MCG/ACT IN AERO: 2.0000 | INHALATION_SPRAY | Freq: Two times a day (BID) | RESPIRATORY_TRACT | 0 refills | Status: DC

## 2017-05-31 MED ORDER — DEXTROMETHORPHAN POLISTIREX ER 30 MG/5ML PO SUER: 15.0000 mg | Freq: Two times a day (BID) | ORAL | 0 refills | Status: DC

## 2017-05-31 NOTE — Progress Notes (Signed)
Nutrition Follow Up  DOCUMENTATION CODES:   Non-severe (moderate) malnutrition in context of chronic illness  INTERVENTION:    Continue Ensure Enlive po BID, each supplement provides 350 kcal and 20 grams of protein  NEW NUTRITION DIAGNOSIS:   Moderate Malnutrition related to chronic illness(COPD) as evidenced by moderate muscle depletion, moderate fat depletion, ongoing  GOAL:   Patient will meet greater than or equal to 90% of their needs, progressing  MONITOR:   PO intake, Supplement acceptance, Labs, Skin, Weight trends  ASSESSMENT:   72 y.o. Female with PMH significant of COPD. Presented with a COPD exacerbation.   RD spoke with pt at bedside. She is pleasant. Reports her appetite is getting better. Had her pancakes this am. PO intake variable at 50-75% per flowsheet records. Drinking at least 1 Ensure Enlive supplement per day. Labs and medications reviewed.  NUTRITION - FOCUSED PHYSICAL EXAM:    Most Recent Value  Orbital Region  Mild depletion  Upper Arm Region  Moderate depletion  Thoracic and Lumbar Region  Moderate depletion  Buccal Region  Mild depletion  Temple Region  Moderate depletion  Clavicle Bone Region  Moderate depletion  Clavicle and Acromion Bone Region  Moderate depletion  Scapular Bone Region  Moderate depletion  Dorsal Hand  Moderate depletion  Patellar Region  Moderate depletion  Anterior Thigh Region  Moderate depletion  Posterior Calf Region  Moderate depletion  Edema (RD Assessment)  None     Diet Order:  Diet Heart Room service appropriate? Yes; Fluid consistency: Thin  EDUCATION NEEDS:   Not appropriate for education at this time  Skin:  Skin Assessment: Reviewed RN Assessment  Last BM:  3/14  Height:   Ht Readings from Last 1 Encounters:  05/26/17 4\' 11"  (1.499 m)   Weight:   Wt Readings from Last 1 Encounters:  05/30/17 125 lb 3.5 oz (56.8 kg)   Ideal Body Weight:  44.6 kg  BMI:  Body mass index is 25.29  kg/m.  Estimated Nutritional Needs:   Kcal:  1500-1700  Protein:  70-85 gm  Fluid:  1.5-1.7 L  Arthur Holms, RD, LDN Pager #: [phone] After-Hours Pager #: [phone]

## 2017-05-31 NOTE — Care Management Note (Signed)
Case Management Note  Patient Details  Name: Deborah Wallace MRN: [id number] Date of Birth: [birth year]  Subjective/Objective:   Patient for dc to home today with [facility] services with [facility] for HHPT, HHRN and home oxygen with [employer], the oxygen tank is in patient's room, NCM faxed information to [facility] for the oxygen.  Patient's daughter n law Arrie Aran is here to transport her home.                  Action/Plan: DC home with [place] with [facility] and oxygen with [employer].   Expected Discharge Date:  05/31/17               Expected Discharge Plan:  [place]  In-House Referral:  Nutrition  Discharge planning Services  CM Consult  Post Acute Care Choice:  Home Health, Durable Medical Equipment Choice offered to:  Patient, Adult Children  DME Arranged:  Oxygen DME Agency:  [employer]  HH Arranged:  PT, RN [place] Agency:  [place]  Status of Service:  Completed, signed off  If discussed at [place] of Stay Meetings, dates discussed:    Additional Comments:  Zenon Mayo, RN 05/31/2017, 3:35 PM

## 2017-05-31 NOTE — Progress Notes (Signed)
SATURATION QUALIFICATIONS: (This note is used to comply with regulatory documentation for home oxygen)  Patient Saturations on Room Air at Rest =93%  Patient Saturations on Room Air while Ambulating = 88%  Patient Saturations on 2L Liters of oxygen while Ambulating = 93  Please briefly explain why patient needs home oxygen: Patient with COPD exacerbation, Acute Respiratory Failure.

## 2017-06-01 NOTE — Progress Notes (Signed)
06/01/2017 NCM spoke to dtr, Dawn and [employer] delivered oxygen to home. Jonnie Finner RN CCM Case Mgmt phone [phone]

## 2017-06-03 NOTE — Discharge Summary (Signed)
[facility] Discharge Summary   Patient: Deborah Wallace NLZ:[id number]   PCP: System, Pcp Not In DOB: 1945-05-25   Date of admission: 05/25/2017   Date of discharge: 05/31/2017   Discharge Diagnoses:  Principal Problem:   COPD exacerbation ([place]) Active Problems:   Acute respiratory failure with hypoxia ([place])   Malnutrition of moderate degree   Admitted From: Home Disposition: Home with home health  Recommendations for Outpatient Follow-up:  1. Please follow-up with PCP in 1-2 weeks  Follow-up Information    Mitchell, L.Marlou Sa, MD. Call.   Specialty:  Family Medicine Why:  Establish Primary Care Provider  Contact information: [address] [place] [address] [phone]        [facility]-Home Follow up.   Specialty:  [place] Why:  Will call you to set up initial visit Contact information: [place] [address] [phone]        London Pepper, MD Follow up on 06/04/2017.   Specialty:  Family Medicine Why:  11:30 for new patient hospital follow up Contact information: [address]  [place] [address] [phone]          Diet recommendation: Cardiac diet  Activity: The patient is advised to gradually reintroduce usual activities.  Discharge Condition: good  Code Status: Full code  History of present illness: As per the H and P dictated on admission, "Deborah Wallace is a 72 y.o. female with medical history significant of COPD.  Patient presents to the ED with c/o progressively worsening SOB, cough productive of white sputum.  Symptoms onset 4 days ago.  Treating self with home neb without relief.  Normally smokes 1PPD for past 40 years, hasnt smoked in past 4 days.  No fever/chills."  Hospital Course:  Summary of her active problems in the hospital is as following. Acute respiratory failure with hypoxia Secondary to COPD exacerbation.   Due to acute  bronchitis Present of shortness of breath, started on IV antibiotics and IV steroids.  No acute infiltrates noted on chest x-ray.   Cultures remain negative influenza PCR negative as well Change to oral doxycycline.  Wean down to oral steroids. Patient required 2 L of oxygen on ambulation at the time of the discharge. This was arranged before discharge.  Prescriptions were provided for inhalers  Chest pain Secondary to her respiratory issues.  EKG without any ischemic changes.  PVC noted.  Symptoms have improved.  Normocytic anemia Hemoglobin is stable.  No evidence for overt bleeding.  All other chronic medical condition were stable during the hospitalization.  Patient was seen by physical therapy, who recommended home health, which was arranged by Education officer, museum and case Freight forwarder. On the day of the discharge the patient's vitals were stable, and no other acute medical condition were reported by patient. the patient was felt safe to be discharge at home with home health.  Procedures and Results:  none   Consultations:  none  DISCHARGE MEDICATION: Allergies as of 05/31/2017   No Known Allergies     Medication List    TAKE these medications   albuterol 1.25 MG/3ML nebulizer solution Commonly known as:  ACCUNEB Take 1 ampule by nebulization every 6 (six) hours as needed for wheezing.   albuterol 108 (90 Base) MCG/ACT inhaler Commonly known as:  PROVENTIL HFA;VENTOLIN HFA Inhale into the lungs every 6 (six) hours as needed for wheezing or shortness of breath.   dextromethorphan 30 MG/5ML liquid Commonly  known as:  DELSYM Take 2.5 mLs (15 mg total) by mouth 2 (two) times daily.   feeding supplement (ENSURE ENLIVE) Liqd Take 237 mLs by mouth 2 (two) times daily between meals.   INCRUSE ELLIPTA 62.5 MCG/INH Aepb Generic drug:  umeclidinium bromide Inhale 1 puff into the lungs daily.   mometasone-formoterol 200-5 MCG/ACT Aero Commonly known as:  DULERA Inhale 2 puffs  into the lungs 2 (two) times daily.   nicotine 21 mg/24hr patch Commonly known as:  NICODERM CQ - dosed in mg/24 hours Place 1 patch (21 mg total) onto the skin daily.   predniSONE 10 MG tablet Commonly known as:  DELTASONE Take 50mg  daily for 3days,Take 40mg  daily for 3days,Take 30mg  daily for 3days,Take 20mg  daily for 3days,Take 10mg  daily for 3days, then stop     ASK your doctor about these medications   doxycycline 100 MG tablet Commonly known as:  VIBRA-TABS Take 1 tablet (100 mg total) by mouth every 12 (twelve) hours for 1 day. Ask about: Should I take this medication?      No Known Allergies Discharge Instructions    Diet - low sodium heart healthy   Complete by:  As directed    Increase activity slowly   Complete by:  As directed      Discharge Exam: Filed Weights   05/25/17 2231 05/30/17 0604  Weight: 53.3 kg (117 lb 8.1 oz) 56.8 kg (125 lb 3.5 oz)   Vitals:   05/31/17 0805 05/31/17 1324  BP:  131/64  Pulse:  88  Resp:  20  Temp:  98.1 F (36.7 C)  SpO2: 95% 96%   General: Appear in no distress, no Rash; Oral Mucosa moist. Cardiovascular: S1 and S2 Present, no Murmur, no JVD Respiratory: Bilateral Air entry present and Clear to Auscultation, no Crackles, no wheezes Abdomen: Bowel Sound present, Soft and no tenderness Extremities: no Pedal edema, no calf tenderness Neurology: Grossly no focal neuro deficit.  The results of significant diagnostics from this hospitalization (including imaging, microbiology, ancillary and laboratory) are listed below for reference.    Significant Diagnostic Studies: Dg Chest Port 1 View  Result Date: 05/25/2017 CLINICAL DATA:  Shortness of Breath for several days EXAM: PORTABLE CHEST 1 VIEW COMPARISON:  None. FINDINGS: Cardiac shadow is within normal limits. The lungs are well aerated bilaterally. Mild interstitial changes are seen without focal infiltrate or sizable effusion. No bony abnormality is noted. IMPRESSION: Diffuse  interstitial changes are noted likely of a chronic nature. No acute infiltrate is seen. Electronically Signed   By: Inez Catalina M.D.   On: 05/25/2017 17:28    Microbiology: Recent Results (from the past 240 hour(s))  Blood Culture (routine x 2)     Status: None   Collection Time: 05/25/17  5:04 PM  Result Value Ref Range Status   Specimen Description BLOOD LEFT ANTECUBITAL  Final   Special Requests   Final    BOTTLES DRAWN AEROBIC AND ANAEROBIC Blood Culture adequate volume   Culture   Final    NO GROWTH 5 DAYS Performed at [facility], [address] [place],  [address]    Report Status 05/30/2017 FINAL  Final  Blood Culture (routine x 2)     Status: None   Collection Time: 05/25/17  5:09 PM  Result Value Ref Range Status   Specimen Description BLOOD BLOOD RIGHT WRIST  Final   Special Requests   Final    BOTTLES DRAWN AEROBIC AND ANAEROBIC Blood Culture adequate  volume   Culture   Final    NO GROWTH 5 DAYS Performed at [facility], [place] [address], [place], [place] [address]    Report Status 05/30/2017 FINAL  Final  Urine culture     Status: None   Collection Time: 05/25/17  5:30 PM  Result Value Ref Range Status   Specimen Description URINE, RANDOM  Final   Special Requests NONE  Final   Culture   Final    NO GROWTH Performed at [facility], [place] [address], [place], [place] [address]    Report Status 05/26/2017 FINAL  Final     Labs: CBC: Recent Labs  Lab 05/28/17 0816 05/30/17 0214  WBC 12.2* 11.5*  HGB 10.9* 11.1*  HCT 33.9* 34.7*  MCV 99.7 96.4  PLT 360 229   Basic Metabolic Panel: Recent Labs  Lab 05/28/17 0816 05/30/17 0214 05/31/17 0247  NA 141 137  --   K 3.9 4.9  --   CL 103 97*  --   CO2 30 31  --   GLUCOSE 148* 184*  --   BUN 29* 29*  --   CREATININE 0.72 0.72  --   CALCIUM 8.7* 8.7*  --   MG  --   --  1.9   Liver Function Tests: No results for input(s): AST, ALT, ALKPHOS, BILITOT, PROT, ALBUMIN in the last  168 hours. No results for input(s): LIPASE, AMYLASE in the last 168 hours. No results for input(s): AMMONIA in the last 168 hours. Cardiac Enzymes: No results for input(s): CKTOTAL, CKMB, CKMBINDEX, TROPONINI in the last 168 hours. BNP (last 3 results) Recent Labs    05/25/17 1709  BNP 390.9*   CBG: No results for input(s): GLUCAP in the last 168 hours. Time spent: 35 minutes  Signed:  Berle Mull  [facility] 05/31/2017 , 8:17 AM

## 2017-06-04 DIAGNOSIS — Z09 Encounter for follow-up examination after completed treatment for conditions other than malignant neoplasm: Secondary | ICD-10-CM | POA: Diagnosis not present

## 2017-06-04 DIAGNOSIS — J441 Chronic obstructive pulmonary disease with (acute) exacerbation: Secondary | ICD-10-CM | POA: Diagnosis not present

## 2017-06-04 DIAGNOSIS — D649 Anemia, unspecified: Secondary | ICD-10-CM | POA: Diagnosis not present

## 2017-06-04 DIAGNOSIS — J449 Chronic obstructive pulmonary disease, unspecified: Secondary | ICD-10-CM | POA: Diagnosis not present

## 2017-06-05 DIAGNOSIS — J441 Chronic obstructive pulmonary disease with (acute) exacerbation: Secondary | ICD-10-CM | POA: Diagnosis not present

## 2017-06-05 DIAGNOSIS — F17210 Nicotine dependence, cigarettes, uncomplicated: Secondary | ICD-10-CM | POA: Diagnosis not present

## 2017-06-05 DIAGNOSIS — Z9981 Dependence on supplemental oxygen: Secondary | ICD-10-CM | POA: Diagnosis not present

## 2017-06-05 DIAGNOSIS — D649 Anemia, unspecified: Secondary | ICD-10-CM | POA: Diagnosis not present

## 2017-06-05 DIAGNOSIS — J9601 Acute respiratory failure with hypoxia: Secondary | ICD-10-CM | POA: Diagnosis not present

## 2017-06-07 DIAGNOSIS — Z9981 Dependence on supplemental oxygen: Secondary | ICD-10-CM | POA: Diagnosis not present

## 2017-06-07 DIAGNOSIS — J441 Chronic obstructive pulmonary disease with (acute) exacerbation: Secondary | ICD-10-CM | POA: Diagnosis not present

## 2017-06-07 DIAGNOSIS — D649 Anemia, unspecified: Secondary | ICD-10-CM | POA: Diagnosis not present

## 2017-06-07 DIAGNOSIS — J9601 Acute respiratory failure with hypoxia: Secondary | ICD-10-CM | POA: Diagnosis not present

## 2017-06-07 DIAGNOSIS — F17210 Nicotine dependence, cigarettes, uncomplicated: Secondary | ICD-10-CM | POA: Diagnosis not present

## 2017-06-12 DIAGNOSIS — F17210 Nicotine dependence, cigarettes, uncomplicated: Secondary | ICD-10-CM | POA: Diagnosis not present

## 2017-06-12 DIAGNOSIS — J441 Chronic obstructive pulmonary disease with (acute) exacerbation: Secondary | ICD-10-CM | POA: Diagnosis not present

## 2017-06-12 DIAGNOSIS — D649 Anemia, unspecified: Secondary | ICD-10-CM | POA: Diagnosis not present

## 2017-06-12 DIAGNOSIS — Z9981 Dependence on supplemental oxygen: Secondary | ICD-10-CM | POA: Diagnosis not present

## 2017-06-12 DIAGNOSIS — J9601 Acute respiratory failure with hypoxia: Secondary | ICD-10-CM | POA: Diagnosis not present

## 2017-06-20 DIAGNOSIS — D649 Anemia, unspecified: Secondary | ICD-10-CM | POA: Diagnosis not present

## 2017-06-20 DIAGNOSIS — J441 Chronic obstructive pulmonary disease with (acute) exacerbation: Secondary | ICD-10-CM | POA: Diagnosis not present

## 2017-06-20 DIAGNOSIS — Z9981 Dependence on supplemental oxygen: Secondary | ICD-10-CM | POA: Diagnosis not present

## 2017-06-20 DIAGNOSIS — J9601 Acute respiratory failure with hypoxia: Secondary | ICD-10-CM | POA: Diagnosis not present

## 2017-06-20 DIAGNOSIS — F17210 Nicotine dependence, cigarettes, uncomplicated: Secondary | ICD-10-CM | POA: Diagnosis not present

## 2017-07-04 DIAGNOSIS — J9601 Acute respiratory failure with hypoxia: Secondary | ICD-10-CM | POA: Diagnosis not present

## 2017-07-04 DIAGNOSIS — J441 Chronic obstructive pulmonary disease with (acute) exacerbation: Secondary | ICD-10-CM | POA: Diagnosis not present

## 2017-07-04 DIAGNOSIS — D649 Anemia, unspecified: Secondary | ICD-10-CM | POA: Diagnosis not present

## 2017-07-04 DIAGNOSIS — F17210 Nicotine dependence, cigarettes, uncomplicated: Secondary | ICD-10-CM | POA: Diagnosis not present

## 2017-07-04 DIAGNOSIS — Z9981 Dependence on supplemental oxygen: Secondary | ICD-10-CM | POA: Diagnosis not present

## 2017-07-11 DIAGNOSIS — J441 Chronic obstructive pulmonary disease with (acute) exacerbation: Secondary | ICD-10-CM | POA: Diagnosis not present

## 2017-07-11 DIAGNOSIS — F17210 Nicotine dependence, cigarettes, uncomplicated: Secondary | ICD-10-CM | POA: Diagnosis not present

## 2017-07-11 DIAGNOSIS — Z9981 Dependence on supplemental oxygen: Secondary | ICD-10-CM | POA: Diagnosis not present

## 2017-07-11 DIAGNOSIS — J9601 Acute respiratory failure with hypoxia: Secondary | ICD-10-CM | POA: Diagnosis not present

## 2017-07-11 DIAGNOSIS — D649 Anemia, unspecified: Secondary | ICD-10-CM | POA: Diagnosis not present

## 2017-07-31 DIAGNOSIS — J9601 Acute respiratory failure with hypoxia: Secondary | ICD-10-CM | POA: Diagnosis not present

## 2017-07-31 DIAGNOSIS — F17210 Nicotine dependence, cigarettes, uncomplicated: Secondary | ICD-10-CM | POA: Diagnosis not present

## 2017-07-31 DIAGNOSIS — J441 Chronic obstructive pulmonary disease with (acute) exacerbation: Secondary | ICD-10-CM | POA: Diagnosis not present

## 2017-07-31 DIAGNOSIS — Z9981 Dependence on supplemental oxygen: Secondary | ICD-10-CM | POA: Diagnosis not present

## 2017-07-31 DIAGNOSIS — D649 Anemia, unspecified: Secondary | ICD-10-CM | POA: Diagnosis not present

## 2017-11-15 DIAGNOSIS — H2513 Age-related nuclear cataract, bilateral: Secondary | ICD-10-CM | POA: Diagnosis not present

## 2017-12-09 DIAGNOSIS — Z1211 Encounter for screening for malignant neoplasm of colon: Secondary | ICD-10-CM | POA: Diagnosis not present

## 2017-12-09 DIAGNOSIS — J449 Chronic obstructive pulmonary disease, unspecified: Secondary | ICD-10-CM | POA: Diagnosis not present

## 2017-12-09 DIAGNOSIS — D649 Anemia, unspecified: Secondary | ICD-10-CM | POA: Diagnosis not present

## 2017-12-09 DIAGNOSIS — Z720 Tobacco use: Secondary | ICD-10-CM | POA: Diagnosis not present

## 2017-12-09 DIAGNOSIS — H9190 Unspecified hearing loss, unspecified ear: Secondary | ICD-10-CM | POA: Diagnosis not present

## 2017-12-09 DIAGNOSIS — Z23 Encounter for immunization: Secondary | ICD-10-CM | POA: Diagnosis not present

## 2017-12-09 DIAGNOSIS — Z0000 Encounter for general adult medical examination without abnormal findings: Secondary | ICD-10-CM | POA: Diagnosis not present

## 2017-12-23 DIAGNOSIS — Z0000 Encounter for general adult medical examination without abnormal findings: Secondary | ICD-10-CM | POA: Diagnosis not present

## 2017-12-23 DIAGNOSIS — Z136 Encounter for screening for cardiovascular disorders: Secondary | ICD-10-CM | POA: Diagnosis not present

## 2017-12-23 DIAGNOSIS — D649 Anemia, unspecified: Secondary | ICD-10-CM | POA: Diagnosis not present

## 2017-12-26 ENCOUNTER — Institutional Professional Consult (permissible substitution): Payer: Medicare Other | Admitting: Pulmonary Disease

## 2017-12-26 NOTE — Progress Notes (Deleted)
Synopsis: Referred in October 2019 for COPD by London Pepper, MD  Subjective:   PATIENT ID: Deborah Wallace GENDER: female DOB: 05/31/1945, MRN: [id number]  No chief complaint on file.   HPI  ***  Past Medical History:  Diagnosis Date  . COPD (chronic obstructive pulmonary disease) ([place])      No family history on file.   No past surgical history on file.  Social History   Socioeconomic History  . Marital status: Divorced    Spouse name: Not on file  . Number of children: Not on file  . Years of education: Not on file  . Highest education level: Not on file  Occupational History  . Not on file  Social Needs  . Financial resource strain: Not on file  . Food insecurity:    Worry: Not on file    Inability: Not on file  . Transportation needs:    Medical: Not on file    Non-medical: Not on file  Tobacco Use  . Smoking status: Current Some Day Smoker  . Smokeless tobacco: Never Used  Substance and Sexual Activity  . Alcohol use: No    Frequency: Never  . Drug use: Not on file  . Sexual activity: Not on file  Lifestyle  . Physical activity:    Days per week: Not on file    Minutes per session: Not on file  . Stress: Not on file  Relationships  . Social connections:    Talks on phone: Not on file    Gets together: Not on file    Attends religious service: Not on file    Active member of club or organization: Not on file    Attends meetings of clubs or organizations: Not on file    Relationship status: Not on file  . Intimate partner violence:    Fear of current or ex partner: Not on file    Emotionally abused: Not on file    Physically abused: Not on file    Forced sexual activity: Not on file  Other Topics Concern  . Not on file  Social History Narrative  . Not on file     No Known Allergies   Outpatient Medications Prior to Visit  Medication Sig Dispense Refill  . albuterol (ACCUNEB) 1.25 MG/3ML nebulizer solution Take 1 ampule by nebulization  every 6 (six) hours as needed for wheezing.    Marland Kitchen albuterol (PROVENTIL HFA;VENTOLIN HFA) 108 (90 Base) MCG/ACT inhaler Inhale into the lungs every 6 (six) hours as needed for wheezing or shortness of breath.    . dextromethorphan (DELSYM) 30 MG/5ML liquid Take 2.5 mLs (15 mg total) by mouth 2 (two) times daily. 89 mL 0  . feeding supplement, ENSURE ENLIVE, (ENSURE ENLIVE) LIQD Take 237 mLs by mouth 2 (two) times daily between meals. 237 mL 12  . mometasone-formoterol (DULERA) 200-5 MCG/ACT AERO Inhale 2 puffs into the lungs 2 (two) times daily. 1 Inhaler 0  . nicotine (NICODERM CQ - DOSED IN MG/24 HOURS) 21 mg/24hr patch Place 1 patch (21 mg total) onto the skin daily. 28 patch 0  . predniSONE (DELTASONE) 10 MG tablet Take 50mg  daily for 3days,Take 40mg  daily for 3days,Take 30mg  daily for 3days,Take 20mg  daily for 3days,Take 10mg  daily for 3days, then stop 45 tablet 0  . umeclidinium bromide (INCRUSE ELLIPTA) 62.5 MCG/INH AEPB Inhale 1 puff into the lungs daily.     No facility-administered medications prior to visit.     ROS   Objective:  Physical Exam   There were no vitals filed for this visit.   on *** LPM *** RA BMI Readings from Last 3 Encounters:  05/30/17 25.29 kg/m  12/12/12 25.54 kg/m   Wt Readings from Last 3 Encounters:  05/30/17 125 lb 3.5 oz (56.8 kg)  12/12/12 151 lb 1.6 oz (68.5 kg)     CBC    Component Value Date/Time   WBC 11.5 (H) 05/30/2017 0214   RBC 3.60 (L) 05/30/2017 0214   HGB 11.1 (L) 05/30/2017 0214   HCT 34.7 (L) 05/30/2017 0214   PLT 344 05/30/2017 0214   MCV 96.4 05/30/2017 0214   MCH 30.8 05/30/2017 0214   MCHC 32.0 05/30/2017 0214   RDW 13.2 05/30/2017 0214    ***  Chest Imaging: ***  Pulmonary Functions Testing Results:  FeNO: ***  Pathology: ***  Echocardiogram: ***  Heart Catheterization: ***    Assessment & Plan:   SOB (shortness of breath)  Discussion: ***   Current Outpatient Medications:  .  albuterol  (ACCUNEB) 1.25 MG/3ML nebulizer solution, Take 1 ampule by nebulization every 6 (six) hours as needed for wheezing., Disp: , Rfl:  .  albuterol (PROVENTIL HFA;VENTOLIN HFA) 108 (90 Base) MCG/ACT inhaler, Inhale into the lungs every 6 (six) hours as needed for wheezing or shortness of breath., Disp: , Rfl:  .  dextromethorphan (DELSYM) 30 MG/5ML liquid, Take 2.5 mLs (15 mg total) by mouth 2 (two) times daily., Disp: 89 mL, Rfl: 0 .  feeding supplement, ENSURE ENLIVE, (ENSURE ENLIVE) LIQD, Take 237 mLs by mouth 2 (two) times daily between meals., Disp: 237 mL, Rfl: 12 .  mometasone-formoterol (DULERA) 200-5 MCG/ACT AERO, Inhale 2 puffs into the lungs 2 (two) times daily., Disp: 1 Inhaler, Rfl: 0 .  nicotine (NICODERM CQ - DOSED IN MG/24 HOURS) 21 mg/24hr patch, Place 1 patch (21 mg total) onto the skin daily., Disp: 28 patch, Rfl: 0 .  predniSONE (DELTASONE) 10 MG tablet, Take 50mg  daily for 3days,Take 40mg  daily for 3days,Take 30mg  daily for 3days,Take 20mg  daily for 3days,Take 10mg  daily for 3days, then stop, Disp: 45 tablet, Rfl: 0 .  umeclidinium bromide (INCRUSE ELLIPTA) 62.5 MCG/INH AEPB, Inhale 1 puff into the lungs daily., Disp: , Rfl:    Garner Nash, DO [facility] 12/26/2017 8:55 AM

## 2017-12-31 NOTE — Progress Notes (Signed)
Synopsis: Referred in Oct. 2019 for COPD by London Pepper, MD  Subjective:   PATIENT ID: Deborah Wallace GENDER: female DOB: 01/02/1946, MRN: [id number]  Chief Complaint  Patient presents with  . Consult    COPD consult, states her PCP wanted her to get established with pulomonologist. Has productive cough with clear mucous and denies chest discomfort. States she has pulse ox at home and her numbers are good. Has 2L PRN oxygen.     PMH of COPD, she use to see Otho Bellows in [place], [place]. Currently managed with albuterol nebs, dulera and incruse. She states that her breathing is more difficult here after moving. If its really cold she has trouble breathing and especially when its hot she has trouble breathing. She is a current smoker started in high school, currently at 0.5 ppd for most of the time. In March she was hospitalized for pna and COPD exacerbation. She did have a COPD flare and she was treated in the ED about 3-4 years ago. Last time she was on abx and steroids was in march. Today she does endorse a history of DOE. She has to use a buggy at the grocery store to walk with. She has been on o2 in the past. Doesn't need it currently. Not been walked in a long time.    Past Medical History:  Diagnosis Date  . COPD (chronic obstructive pulmonary disease) ([place])   . Pneumonia      Family History  Problem Relation Age of Onset  . Hypertension Mother   . Alzheimer's disease Father   . Bladder Cancer Sister      Past Surgical History:  Procedure Laterality Date  . DILATION AND CURETTAGE, DIAGNOSTIC / THERAPEUTIC    . SKIN CANCER EXCISION      Social History   Socioeconomic History  . Marital status: Divorced    Spouse name: Not on file  . Number of children: Not on file  . Years of education: Not on file  . Highest education level: Not on file  Occupational History  . Not on file  Social Needs  . Financial resource strain: Not on file  . Food insecurity:    Worry:  Not on file    Inability: Not on file  . Transportation needs:    Medical: Not on file    Non-medical: Not on file  Tobacco Use  . Smoking status: Current Some Day Smoker  . Smokeless tobacco: Never Used  . Tobacco comment: half pack per day 01/01/2018  Substance and Sexual Activity  . Alcohol use: No    Frequency: Never  . Drug use: Not on file  . Sexual activity: Not on file  Lifestyle  . Physical activity:    Days per week: Not on file    Minutes per session: Not on file  . Stress: Not on file  Relationships  . Social connections:    Talks on phone: Not on file    Gets together: Not on file    Attends religious service: Not on file    Active member of club or organization: Not on file    Attends meetings of clubs or organizations: Not on file    Relationship status: Not on file  . Intimate partner violence:    Fear of current or ex partner: Not on file    Emotionally abused: Not on file    Physically abused: Not on file    Forced sexual activity: Not on file  Other  Topics Concern  . Not on file  Social History Narrative  . Not on file     No Known Allergies   Outpatient Medications Prior to Visit  Medication Sig Dispense Refill  . albuterol (ACCUNEB) 1.25 MG/3ML nebulizer solution Take 1 ampule by nebulization every 6 (six) hours as needed for wheezing.    Marland Kitchen dextromethorphan (DELSYM) 30 MG/5ML liquid Take 2.5 mLs (15 mg total) by mouth 2 (two) times daily. 89 mL 0  . feeding supplement, ENSURE ENLIVE, (ENSURE ENLIVE) LIQD Take 237 mLs by mouth 2 (two) times daily between meals. 237 mL 12  . mometasone-formoterol (DULERA) 200-5 MCG/ACT AERO Inhale 2 puffs into the lungs 2 (two) times daily. 1 Inhaler 0  . umeclidinium bromide (INCRUSE ELLIPTA) 62.5 MCG/INH AEPB Inhale 1 puff into the lungs daily.    Marland Kitchen albuterol (PROVENTIL HFA;VENTOLIN HFA) 108 (90 Base) MCG/ACT inhaler Inhale into the lungs every 6 (six) hours as needed for wheezing or shortness of breath.    .  nicotine (NICODERM CQ - DOSED IN MG/24 HOURS) 21 mg/24hr patch Place 1 patch (21 mg total) onto the skin daily. (Patient not taking: Reported on 01/01/2018) 28 patch 0  . predniSONE (DELTASONE) 10 MG tablet Take 50mg  daily for 3days,Take 40mg  daily for 3days,Take 30mg  daily for 3days,Take 20mg  daily for 3days,Take 10mg  daily for 3days, then stop (Patient not taking: Reported on 01/01/2018) 45 tablet 0   No facility-administered medications prior to visit.     Review of Systems  Constitutional: Negative for chills, fever, malaise/fatigue and weight loss.  HENT: Negative for hearing loss, sore throat and tinnitus.   Eyes: Negative for blurred vision and double vision.  Respiratory: Positive for cough, sputum production, shortness of breath and wheezing. Negative for hemoptysis and stridor.   Cardiovascular: Negative for chest pain, palpitations, orthopnea, leg swelling and PND.  Gastrointestinal: Negative for abdominal pain, constipation, diarrhea, heartburn, nausea and vomiting.  Genitourinary: Negative for dysuria, hematuria and urgency.  Musculoskeletal: Negative for joint pain and myalgias.  Skin: Negative for itching and rash.  Neurological: Negative for dizziness, tingling, weakness and headaches.  Endo/Heme/Allergies: Negative for environmental allergies. Does not bruise/bleed easily.  Psychiatric/Behavioral: Negative for depression. The patient is not nervous/anxious and does not have insomnia.   All other systems reviewed and are negative.    Objective:  Physical Exam  Constitutional: She is oriented to person, place, and time. She appears well-developed. No distress.  HENT:  Head: Normocephalic and atraumatic.  Mouth/Throat: Oropharynx is clear and moist. No oropharyngeal exudate.  Eyes: Pupils are equal, round, and reactive to light. Conjunctivae and EOM are normal.  Neck: No JVD present. No tracheal deviation present.  Loss of supraclavicular fat  Cardiovascular: Normal  rate, regular rhythm, S1 normal, S2 normal and intact distal pulses.  Distant heart tones  Pulmonary/Chest: No accessory muscle usage or stridor. No tachypnea. She has decreased breath sounds (throughout all lung fields). She has wheezes. She has no rhonchi. She has no rales.  Increased AP chest diameter  Abdominal: Soft. Bowel sounds are normal. She exhibits no distension. There is no tenderness.  Musculoskeletal: She exhibits deformity (muscle wasting ). She exhibits no edema.  Neurological: She is alert and oriented to person, place, and time.  Skin: Skin is warm and dry. Capillary refill takes less than 2 seconds. No rash noted.  Psychiatric: She has a normal mood and affect. Her behavior is normal.  Vitals reviewed.    Vitals:   01/01/18 1619  BP: Marland Kitchen)  160/90  Pulse: 67  SpO2: 97%  Weight: 119 lb 12.8 oz (54.3 kg)  Height: 5\' 1"  (1.549 m)   97% on RA BMI Readings from Last 3 Encounters:  01/01/18 22.64 kg/m  05/30/17 25.29 kg/m  12/12/12 25.54 kg/m   Wt Readings from Last 3 Encounters:  01/01/18 119 lb 12.8 oz (54.3 kg)  05/30/17 125 lb 3.5 oz (56.8 kg)  12/12/12 151 lb 1.6 oz (68.5 kg)     CBC    Component Value Date/Time   WBC 11.5 (H) 05/30/2017 0214   RBC 3.60 (L) 05/30/2017 0214   HGB 11.1 (L) 05/30/2017 0214   HCT 34.7 (L) 05/30/2017 0214   PLT 344 05/30/2017 0214   MCV 96.4 05/30/2017 0214   MCH 30.8 05/30/2017 0214   MCHC 32.0 05/30/2017 0214   RDW 13.2 05/30/2017 0214    Chest Imaging: 05/25/2017 CXR - BL interstitial markings.   Pulmonary Functions Testing Results: None   FeNO: None   Pathology: None   Echocardiogram: None   Heart Catheterization: None     Assessment & Plan:   Shortness of breath - Plan: Pulmonary function test, 6 minute walk, CT Chest Wo Contrast, Cholecalciferol (VITAMIN D) 2000 units CAPS, Pulse oximetry, overnight, AMB referral to pulmonary rehabilitation  Chronic obstructive pulmonary disease, unspecified COPD type  ([place]) - Plan: Pulmonary function test, 6 minute walk, AMB referral to pulmonary rehabilitation  DOE (dyspnea on exertion)  Unintentional weight loss  Discussion:  This is a 72 year old female with a significant smoking history.  Her symptoms are consistent with underlying COPD.  She does have prior chest x-ray with bilateral interstitial markings and evidence of possible emphysema.  These images were reviewed by me.  She seems to be well compensated using her ICS/LABA/LAMA.  Unfortunately she has had a significant amount of weight loss unintentionally over the past 6 months to a year.  She does admit to at least an additional 10 pound weight loss in the past 3 months.  Plan: We will obtain full PFTs +6-minute walk and refer to pulmonary rehabilitation. Continue current inhaler regimen, Dulera and Incruse. Should start daily vitamin D supplementation 2000 units daily. We will obtain an overnight pulse oximetry. Walk today for oxygen desaturation evaluation in the office. Noncontrasted CT of the chest for evaluation of underlying malignancy in the setting of prolonged tobacco abuse and unintentional weight loss.  Additionally we will evaluate the bilateral interstitial markings seen on chest imaging. Patient was counseled on smoking cessation.  Patient was encouraged to quit smoking.  Greater than 10 minutes of this patient's office visit was spent on smoking cessation counseling.  Greater than 50% of this patient's 60-minute visit was spent face-to-face discussing the treatment plan and diagnostic evaluation described above.    Current Outpatient Medications:  .  albuterol (ACCUNEB) 1.25 MG/3ML nebulizer solution, Take 1 ampule by nebulization every 6 (six) hours as needed for wheezing., Disp: , Rfl:  .  dextromethorphan (DELSYM) 30 MG/5ML liquid, Take 2.5 mLs (15 mg total) by mouth 2 (two) times daily., Disp: 89 mL, Rfl: 0 .  feeding supplement, ENSURE ENLIVE, (ENSURE ENLIVE) LIQD, Take  237 mLs by mouth 2 (two) times daily between meals., Disp: 237 mL, Rfl: 12 .  mometasone-formoterol (DULERA) 200-5 MCG/ACT AERO, Inhale 2 puffs into the lungs 2 (two) times daily., Disp: 1 Inhaler, Rfl: 0 .  umeclidinium bromide (INCRUSE ELLIPTA) 62.5 MCG/INH AEPB, Inhale 1 puff into the lungs daily., Disp: , Rfl:  .  Cholecalciferol (VITAMIN  D) 2000 units CAPS, Take 1 capsule (2,000 Units total) by mouth daily., Disp: 30 capsule, Rfl: 5   Garner Nash, DO [facility] 01/01/2018 5:29 PM

## 2017-12-31 NOTE — Patient Instructions (Addendum)
Thank you for visiting Dr. Valeta Harms at [facility]. Today we recommend the following:  Please continue your current inhaler regimen and let us know if you need any refills in the meantime.  Orders Placed This Encounter  Procedures  . CT Chest Wo Contrast  . AMB referral to pulmonary rehabilitation  . Pulse oximetry, overnight  . Pulmonary function test  . 6 minute walk   Meds ordered this encounter  Medications  . Cholecalciferol (VITAMIN D) 2000 units CAPS    Sig: Take 1 capsule (2,000 Units total) by mouth daily.    Dispense:  30 capsule    Refill:  5   Return in about 3 months (around 04/03/2018).

## 2018-01-01 ENCOUNTER — Ambulatory Visit (INDEPENDENT_AMBULATORY_CARE_PROVIDER_SITE_OTHER): Payer: Medicare Other | Admitting: Pulmonary Disease

## 2018-01-01 ENCOUNTER — Encounter: Payer: Self-pay | Admitting: Pulmonary Disease

## 2018-01-01 VITALS — BP 160/90 | HR 67 | Ht 61.0 in | Wt 119.8 lb

## 2018-01-01 DIAGNOSIS — R634 Abnormal weight loss: Secondary | ICD-10-CM

## 2018-01-01 DIAGNOSIS — J449 Chronic obstructive pulmonary disease, unspecified: Secondary | ICD-10-CM | POA: Diagnosis not present

## 2018-01-01 DIAGNOSIS — R0609 Other forms of dyspnea: Secondary | ICD-10-CM

## 2018-01-01 DIAGNOSIS — R0602 Shortness of breath: Secondary | ICD-10-CM | POA: Diagnosis not present

## 2018-01-01 MED ORDER — VITAMIN D 50 MCG (2000 UT) PO CAPS: 1.0000 | ORAL_CAPSULE | Freq: Every day | ORAL | 5 refills | Status: DC

## 2018-01-02 NOTE — Addendum Note (Signed)
Addended by: Vivia Ewing on: 01/02/2018 10:05 AM   Modules accepted: Orders

## 2018-01-07 ENCOUNTER — Ambulatory Visit (INDEPENDENT_AMBULATORY_CARE_PROVIDER_SITE_OTHER): Payer: Medicare Other | Admitting: Pulmonary Disease

## 2018-01-07 DIAGNOSIS — J449 Chronic obstructive pulmonary disease, unspecified: Secondary | ICD-10-CM | POA: Diagnosis not present

## 2018-01-07 DIAGNOSIS — R0602 Shortness of breath: Secondary | ICD-10-CM

## 2018-01-07 LAB — PULMONARY FUNCTION TEST
DL/VA % pred: 99 %
DL/VA: 4.12 ml/min/mmHg/L
DLCO UNC: 13.82 ml/min/mmHg
DLCO unc % pred: 75 %
FEF 25-75 Post: 0.5 L/sec
FEF 25-75 Pre: 0.38 L/sec
FEF2575-%Change-Post: 34 %
FEF2575-%Pred-Post: 34 %
FEF2575-%Pred-Pre: 25 %
FEV1-%Change-Post: 12 %
FEV1-%PRED-PRE: 50 %
FEV1-%Pred-Post: 57 %
FEV1-POST: 0.99 L
FEV1-Pre: 0.88 L
FEV1FVC-%Change-Post: 6 %
FEV1FVC-%Pred-Pre: 66 %
FEV6-%Change-Post: 6 %
FEV6-%PRED-POST: 83 %
FEV6-%Pred-Pre: 78 %
FEV6-PRE: 1.72 L
FEV6-Post: 1.83 L
FEV6FVC-%CHANGE-POST: 0 %
FEV6FVC-%PRED-POST: 104 %
FEV6FVC-%PRED-PRE: 103 %
FVC-%Change-Post: 6 %
FVC-%PRED-POST: 80 %
FVC-%Pred-Pre: 75 %
FVC-Post: 1.86 L
FVC-Pre: 1.76 L
PRE FEV6/FVC RATIO: 98 %
Post FEV1/FVC ratio: 53 %
Post FEV6/FVC ratio: 99 %
Pre FEV1/FVC ratio: 50 %
RV % PRED: 191 %
RV: 3.91 L
TLC % PRED: 128 %
TLC: 5.64 L

## 2018-01-07 NOTE — Progress Notes (Signed)
PFT completed today.  

## 2018-01-13 ENCOUNTER — Inpatient Hospital Stay: Admission: RE | Admit: 2018-01-13 | Payer: Medicare Other | Source: Ambulatory Visit

## 2018-01-30 ENCOUNTER — Inpatient Hospital Stay: Admission: RE | Admit: 2018-01-30 | Payer: Medicare Other | Source: Ambulatory Visit

## 2018-02-16 DEATH — deceased

## 2018-02-21 ENCOUNTER — Inpatient Hospital Stay: Admission: RE | Admit: 2018-02-21 | Payer: Medicare Other | Source: Ambulatory Visit

## 2018-04-02 ENCOUNTER — Ambulatory Visit: Payer: Medicare Other | Admitting: Pulmonary Disease

## 2018-04-02 DIAGNOSIS — J449 Chronic obstructive pulmonary disease, unspecified: Secondary | ICD-10-CM | POA: Diagnosis not present

## 2018-05-03 DIAGNOSIS — J449 Chronic obstructive pulmonary disease, unspecified: Secondary | ICD-10-CM | POA: Diagnosis not present

## 2018-06-01 DIAGNOSIS — J449 Chronic obstructive pulmonary disease, unspecified: Secondary | ICD-10-CM | POA: Diagnosis not present

## 2018-07-02 DIAGNOSIS — J449 Chronic obstructive pulmonary disease, unspecified: Secondary | ICD-10-CM | POA: Diagnosis not present

## 2018-08-01 DIAGNOSIS — J449 Chronic obstructive pulmonary disease, unspecified: Secondary | ICD-10-CM | POA: Diagnosis not present

## 2018-08-18 DIAGNOSIS — J449 Chronic obstructive pulmonary disease, unspecified: Secondary | ICD-10-CM | POA: Diagnosis not present

## 2018-08-18 DIAGNOSIS — E7800 Pure hypercholesterolemia, unspecified: Secondary | ICD-10-CM | POA: Diagnosis not present

## 2018-09-01 DIAGNOSIS — J449 Chronic obstructive pulmonary disease, unspecified: Secondary | ICD-10-CM | POA: Diagnosis not present

## 2019-01-30 ENCOUNTER — Other Ambulatory Visit: Payer: Self-pay | Admitting: Family Medicine

## 2019-01-30 DIAGNOSIS — Z1231 Encounter for screening mammogram for malignant neoplasm of breast: Secondary | ICD-10-CM

## 2019-04-03 DIAGNOSIS — J449 Chronic obstructive pulmonary disease, unspecified: Secondary | ICD-10-CM | POA: Diagnosis not present

## 2020-03-02 IMAGING — DX DG CHEST 1V PORT
1 series · 1 of 1 positions shown · non-contrast
Comparison: None.

CLINICAL DATA: Shortness of Breath for several days

EXAM:
PORTABLE CHEST 1 VIEW

[chest ap]
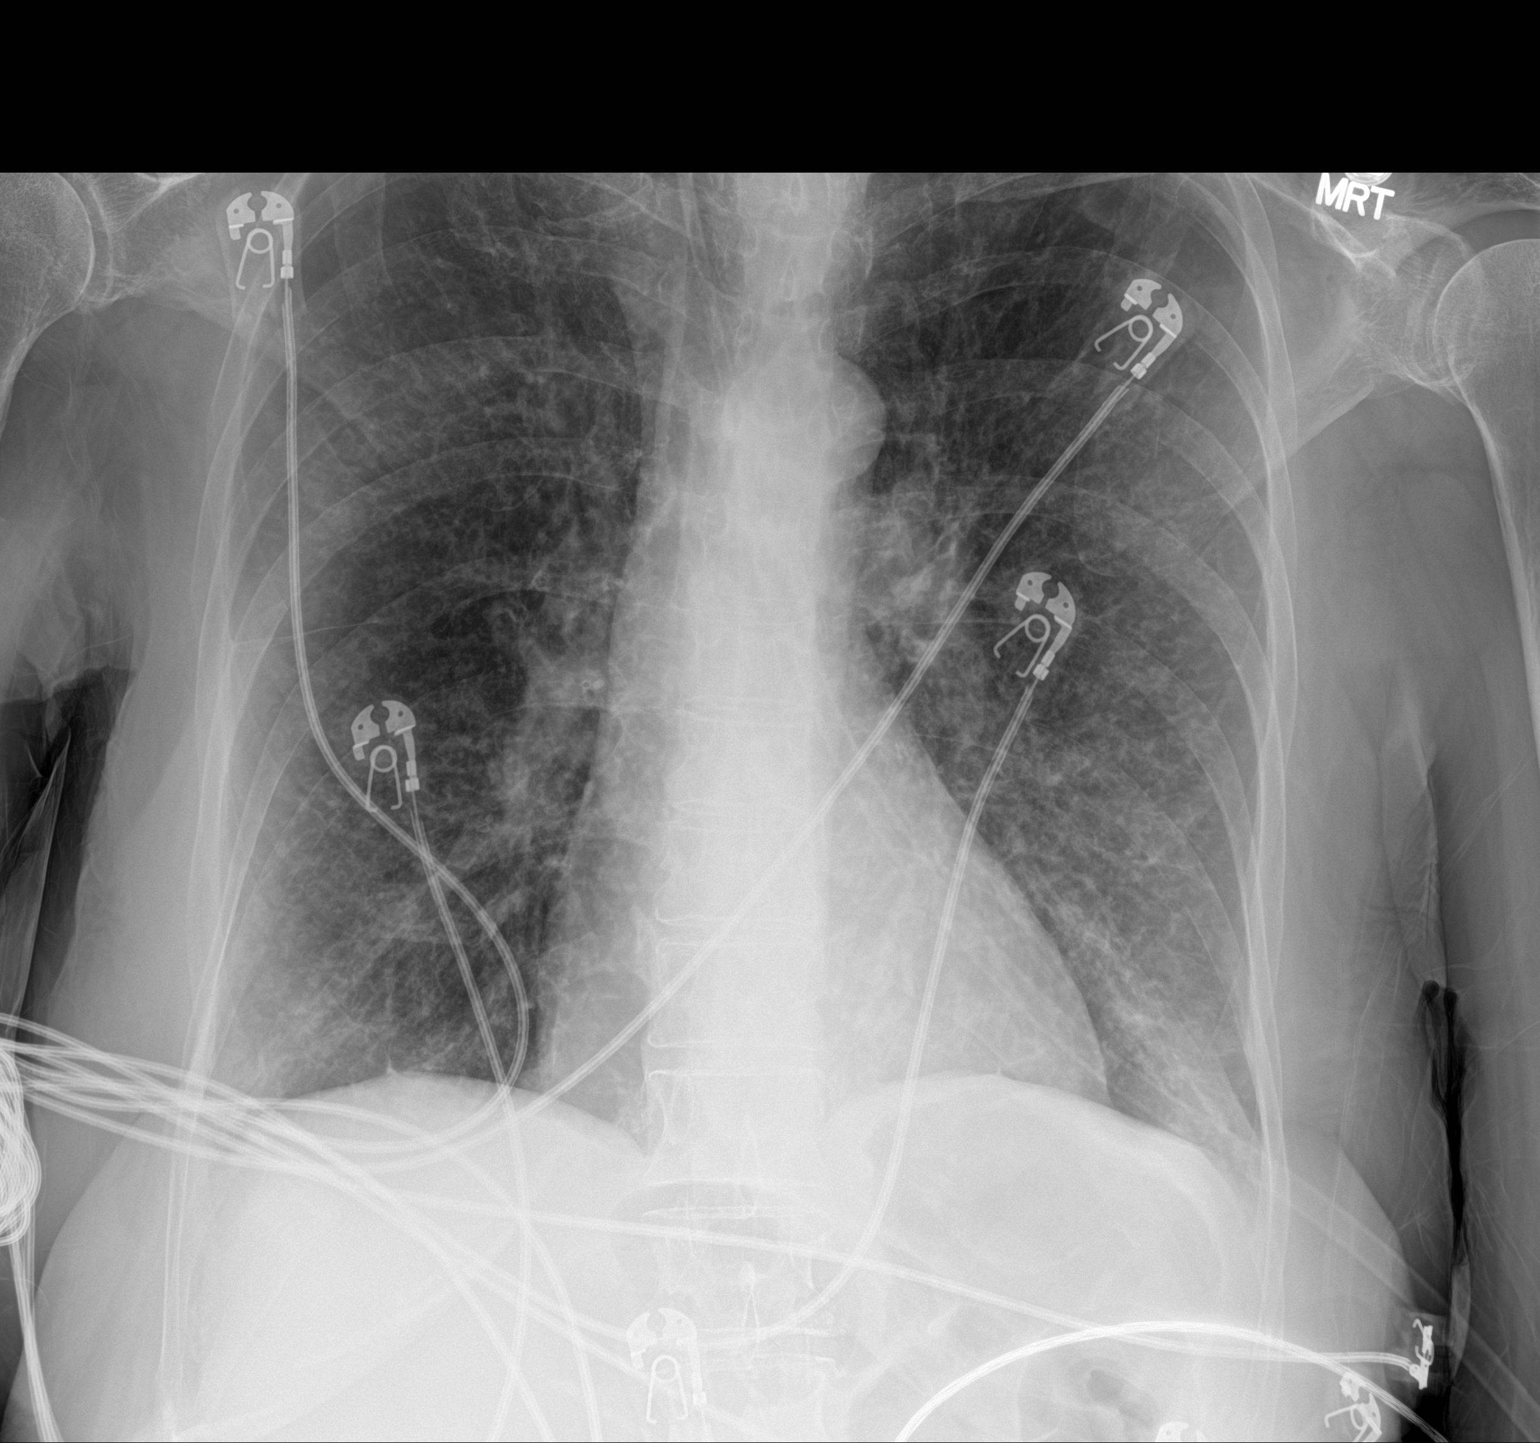

[1 of 1 positions shown; findings below may reference images not displayed]

FINDINGS: Cardiac shadow is within normal limits. The lungs are well aerated
bilaterally. Mild interstitial changes are seen without focal
infiltrate or sizable effusion. No bony abnormality is noted.
IMPRESSION: Diffuse interstitial changes are noted likely of a chronic nature.
No acute infiltrate is seen.

## 2024-01-18 DEATH — deceased
